# Patient Record
Sex: Male | Born: 1945 | Race: White | Hispanic: No | Marital: Married | Smoking: Never smoker
Health system: Southern US, Community
[De-identification: ages and names within clinical notes are randomized; demographics above are authoritative.]

## PROBLEM LIST (undated history)

## (undated) DIAGNOSIS — C801 Malignant (primary) neoplasm, unspecified: Secondary | ICD-10-CM

## (undated) DIAGNOSIS — I10 Essential (primary) hypertension: Secondary | ICD-10-CM

## (undated) DIAGNOSIS — N189 Chronic kidney disease, unspecified: Secondary | ICD-10-CM

## (undated) DIAGNOSIS — R319 Hematuria, unspecified: Secondary | ICD-10-CM

## (undated) DIAGNOSIS — C61 Malignant neoplasm of prostate: Secondary | ICD-10-CM

## (undated) DIAGNOSIS — N2889 Other specified disorders of kidney and ureter: Secondary | ICD-10-CM

## (undated) DIAGNOSIS — J45909 Unspecified asthma, uncomplicated: Secondary | ICD-10-CM

## (undated) DIAGNOSIS — R809 Proteinuria, unspecified: Secondary | ICD-10-CM

## (undated) DIAGNOSIS — E7800 Pure hypercholesterolemia, unspecified: Secondary | ICD-10-CM

## (undated) HISTORY — PX: EYE SURGERY: SHX253

## (undated) HISTORY — PX: PROSTATE SURGERY: SHX751

## (undated) HISTORY — PX: CATARACT EXTRACTION: SUR2

## (undated) HISTORY — PX: OTHER SURGICAL HISTORY: SHX169

---

## 2005-06-18 ENCOUNTER — Ambulatory Visit: Payer: Self-pay | Admitting: Urology

## 2005-07-01 ENCOUNTER — Other Ambulatory Visit: Payer: Self-pay

## 2005-07-03 ENCOUNTER — Ambulatory Visit: Payer: Self-pay | Admitting: Urology

## 2005-07-08 ENCOUNTER — Inpatient Hospital Stay: Payer: Self-pay | Admitting: Urology

## 2006-06-19 IMAGING — CR DG CLAVICLE*R*
1 series · 2 of 2 positions shown · non-contrast
Comparison: none

REASON FOR EXAM: ABNORMAL UPTAKE ON BONE SCAN
COMMENTS:

[Series 1: view not recorded · 0.17mm/px · 2 of 2 slices shown]
[im 1/2]
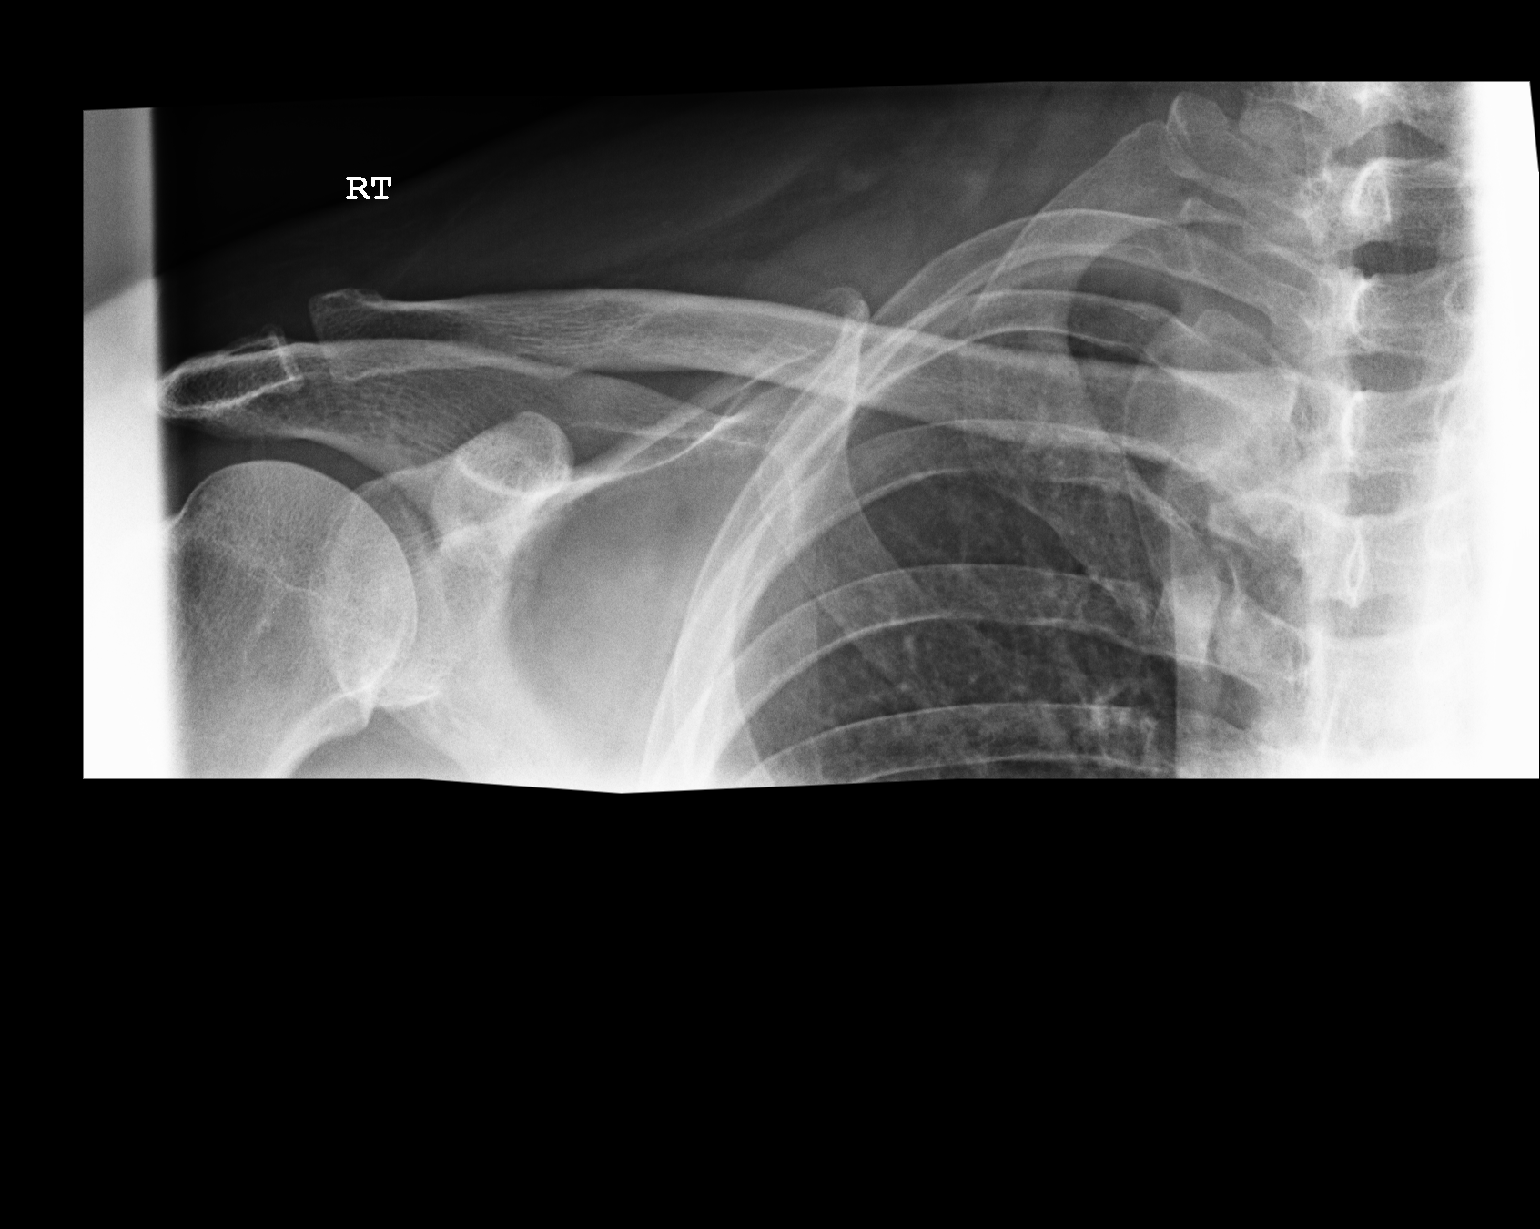
[im 2/2]
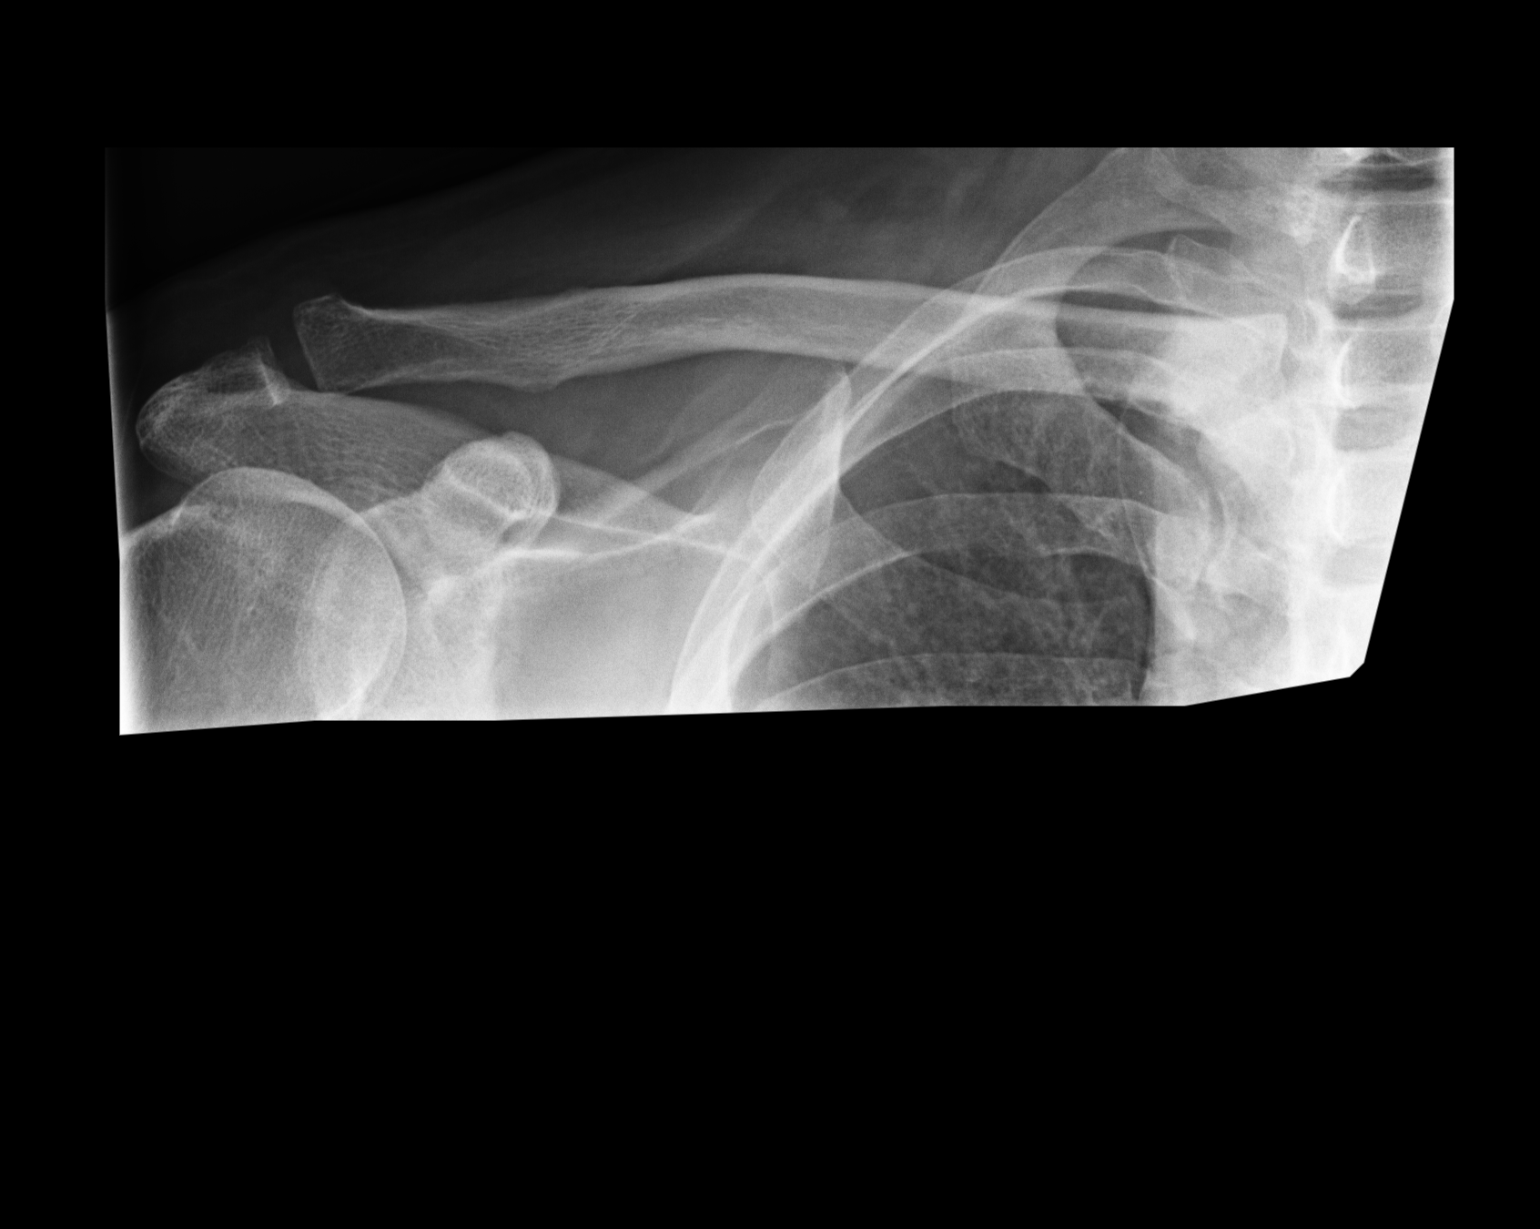

[2 of 2 positions shown; findings below may reference images not displayed]

PROCEDURE:     DXR - DXR CLAVICLE RIGHT  - July 03, 2005  [DATE]

RESULT:       Evaluation of the RIGHT clavicle demonstrates no evidence of
fracture, dislocation or malalignment.  There does not appear to be evidence
of lytic or sclerotic lesions within the RIGHT clavicle.  The RIGHT lung
apex is unremarkable.  Incidental note is made of an azygous lobe.
IMPRESSION: Unremarkable RIGHT clavicle as described above.

## 2006-08-21 ENCOUNTER — Ambulatory Visit: Payer: Self-pay | Admitting: Gastroenterology

## 2009-12-03 ENCOUNTER — Ambulatory Visit: Payer: Self-pay | Admitting: Gastroenterology

## 2013-02-21 ENCOUNTER — Ambulatory Visit: Payer: Self-pay | Admitting: Gastroenterology

## 2017-07-20 ENCOUNTER — Emergency Department: Payer: Medicare Other

## 2017-07-20 ENCOUNTER — Encounter: Payer: Self-pay | Admitting: Emergency Medicine

## 2017-07-20 ENCOUNTER — Inpatient Hospital Stay
Admission: EM | Admit: 2017-07-20 | Discharge: 2017-07-22 | DRG: 176 | Disposition: A | Discharge status: Home / Self Care | Payer: Medicare Other | Attending: Internal Medicine | Admitting: Internal Medicine

## 2017-07-20 DIAGNOSIS — E785 Hyperlipidemia, unspecified: Secondary | ICD-10-CM | POA: Diagnosis present

## 2017-07-20 DIAGNOSIS — Z23 Encounter for immunization: Secondary | ICD-10-CM

## 2017-07-20 DIAGNOSIS — Z7982 Long term (current) use of aspirin: Secondary | ICD-10-CM | POA: Diagnosis not present

## 2017-07-20 DIAGNOSIS — N289 Disorder of kidney and ureter, unspecified: Secondary | ICD-10-CM | POA: Diagnosis present

## 2017-07-20 DIAGNOSIS — R1011 Right upper quadrant pain: Secondary | ICD-10-CM | POA: Diagnosis not present

## 2017-07-20 DIAGNOSIS — I82432 Acute embolism and thrombosis of left popliteal vein: Secondary | ICD-10-CM | POA: Diagnosis present

## 2017-07-20 DIAGNOSIS — Z79899 Other long term (current) drug therapy: Secondary | ICD-10-CM | POA: Diagnosis not present

## 2017-07-20 DIAGNOSIS — J45909 Unspecified asthma, uncomplicated: Secondary | ICD-10-CM | POA: Diagnosis present

## 2017-07-20 DIAGNOSIS — Z833 Family history of diabetes mellitus: Secondary | ICD-10-CM | POA: Diagnosis not present

## 2017-07-20 DIAGNOSIS — Z8546 Personal history of malignant neoplasm of prostate: Secondary | ICD-10-CM

## 2017-07-20 DIAGNOSIS — Z85820 Personal history of malignant melanoma of skin: Secondary | ICD-10-CM

## 2017-07-20 DIAGNOSIS — I10 Essential (primary) hypertension: Secondary | ICD-10-CM | POA: Diagnosis present

## 2017-07-20 DIAGNOSIS — Z8249 Family history of ischemic heart disease and other diseases of the circulatory system: Secondary | ICD-10-CM

## 2017-07-20 DIAGNOSIS — I2699 Other pulmonary embolism without acute cor pulmonale: Principal | ICD-10-CM | POA: Diagnosis present

## 2017-07-20 DIAGNOSIS — Z808 Family history of malignant neoplasm of other organs or systems: Secondary | ICD-10-CM

## 2017-07-20 HISTORY — DX: Essential (primary) hypertension: I10

## 2017-07-20 HISTORY — DX: Unspecified asthma, uncomplicated: J45.909

## 2017-07-20 HISTORY — DX: Pure hypercholesterolemia, unspecified: E78.00

## 2017-07-20 HISTORY — DX: Malignant (primary) neoplasm, unspecified: C80.1

## 2017-07-20 HISTORY — DX: Malignant neoplasm of prostate: C61

## 2017-07-20 LAB — URINALYSIS, COMPLETE (UACMP) WITH MICROSCOPIC
BACTERIA UA: NONE SEEN
BILIRUBIN URINE: NEGATIVE
GLUCOSE, UA: NEGATIVE mg/dL
KETONES UR: 5 mg/dL — AB
LEUKOCYTES UA: NEGATIVE
NITRITE: NEGATIVE
PROTEIN: 100 mg/dL — AB
Specific Gravity, Urine: 1.011 (ref 1.005–1.030)
pH: 5 (ref 5.0–8.0)

## 2017-07-20 LAB — CBC
HEMATOCRIT: 46.1 % (ref 40.0–52.0)
HEMOGLOBIN: 15.7 g/dL (ref 13.0–18.0)
MCH: 29.8 pg (ref 26.0–34.0)
MCHC: 34.1 g/dL (ref 32.0–36.0)
MCV: 87.5 fL (ref 80.0–100.0)
Platelets: 261 10*3/uL (ref 150–440)
RBC: 5.27 MIL/uL (ref 4.40–5.90)
RDW: 13.4 % (ref 11.5–14.5)
WBC: 10 10*3/uL (ref 3.8–10.6)

## 2017-07-20 LAB — COMPREHENSIVE METABOLIC PANEL
ALBUMIN: 4.1 g/dL (ref 3.5–5.0)
ALT: 22 U/L (ref 17–63)
ANION GAP: 8 (ref 5–15)
AST: 22 U/L (ref 15–41)
Alkaline Phosphatase: 70 U/L (ref 38–126)
BILIRUBIN TOTAL: 0.7 mg/dL (ref 0.3–1.2)
BUN: 17 mg/dL (ref 6–20)
CO2: 27 mmol/L (ref 22–32)
Calcium: 10 mg/dL (ref 8.9–10.3)
Chloride: 102 mmol/L (ref 101–111)
Creatinine, Ser: 1.3 mg/dL — ABNORMAL HIGH (ref 0.61–1.24)
GFR calc Af Amer: >60 mL/min (ref >60)
GFR calc non Af Amer: 54 mL/min — ABNORMAL LOW (ref >60)
GLUCOSE: 107 mg/dL — AB (ref 65–99)
POTASSIUM: 4.5 mmol/L (ref 3.5–5.1)
SODIUM: 137 mmol/L (ref 135–145)
TOTAL PROTEIN: 7.8 g/dL (ref 6.5–8.1)

## 2017-07-20 LAB — TROPONIN I: Troponin I: <0.03 ng/mL (ref <0.03)

## 2017-07-20 LAB — LIPASE, BLOOD: LIPASE: 21 U/L (ref 11–51)

## 2017-07-20 MED ORDER — SODIUM CHLORIDE 0.9 % IV BOLUS (SEPSIS)
1000.0000 mL | Freq: Once | INTRAVENOUS | Status: AC
  Administered 2017-07-20: 1000 mL via INTRAVENOUS

## 2017-07-20 MED ORDER — HEPARIN (PORCINE) IN NACL 100-0.45 UNIT/ML-% IJ SOLN: 12.0000 [IU]/kg/h | INTRAMUSCULAR | Status: DC

## 2017-07-20 MED ORDER — HEPARIN BOLUS VIA INFUSION: 4000.0000 [IU] | Freq: Once | INTRAVENOUS | Status: DC

## 2017-07-20 MED ORDER — IOPAMIDOL (ISOVUE-370) INJECTION 76%
75.0000 mL | Freq: Once | INTRAVENOUS | Status: AC | PRN
  Administered 2017-07-20: 75 mL via INTRAVENOUS

## 2017-07-20 MED ORDER — HYDROMORPHONE HCL 1 MG/ML IJ SOLN
0.5000 mg | Freq: Once | INTRAMUSCULAR | Status: AC
  Administered 2017-07-20: 0.5 mg via INTRAVENOUS
  Filled 2017-07-20: qty 1

## 2017-07-20 NOTE — ED Triage Notes (Signed)
Pt c/o right side upper abdominal pain x2 weeks, worse after eating "certain foods" and worse when taking a deep breath. Pt denies chest pain, SOB, as well as N/V/D.

## 2017-07-20 NOTE — ED Provider Notes (Signed)
[facility] Emergency Department Provider Note  ____________________________________________  Time seen: Approximately 10:16 PM  I have reviewed the triage vital signs and the nursing notes.   HISTORY  Chief Complaint Abdominal Pain    HPI Darin Fisher is a 71 y.o. male presenting with right lateral chest wall and right upper quadrant painfor the past 2 weeks. The patient describes a sharp pain that is intermittent and occurs after eating. He has no associated nausea vomiting or diarrhea. No dysuria or hematuria. No shortness of breath, palpitations, fever or chills. Deep breaths make his pain worse. The pain is better with standing and worse with laying down. He has noted intermittent cramping of the left calf when in bed as well.  FH: gallbladder disease; no blood clot hx   Past Medical History:  Diagnosis Date  . Asthma   . Cancer ([place])   . HTN (hypertension)   . Hypercholesterolemia   . Prostate CA ([place])     There are no active problems to display for this patient.   History reviewed. No pertinent surgical history.    Allergies Patient has no known allergies.  History reviewed. No pertinent family history.  Social History Social History  Substance Use Topics  . Smoking status: Never Smoker  . Smokeless tobacco: Never Used  . Alcohol use No    Review of Systems Constitutional: No fever/chills.no lightheadedness or syncope. No diaphoresis. Eyes: No visual changes. ENT: No sore throat. No congestion or rhinorrhea. Cardiovascular: Denies chest pain. Denies palpitations. Respiratory: Denies shortness of breath.  No cough. Gastrointestinal: positive right flank and right lateral, right upper quadrantabdominal pain.  No nausea, no vomiting.  No diarrhea.  No constipation. Genitourinary: Negative for dysuria.no hematuria. Musculoskeletal: Negative for back pain. Skin: Negative for rash. Neurological: Negative for headaches. No focal  numbness, tingling or weakness.     ____________________________________________   PHYSICAL EXAM:  VITAL SIGNS: ED Triage Vitals [07/20/17 1920]  Enc Vitals Group     BP (!) 148/90     Pulse Rate 69     Resp 16     Temp 98 F (36.7 C)     Temp Source Oral     SpO2 100 %     Weight      Height 5\' 10"  (1.778 m)     Head Circumference      Peak Flow      Pain Score      Pain Loc      Pain Edu?      Excl. in [place]?     Constitutional: Alert and oriented. Well appearing and in no acute distress. Answers questions appropriately. Eyes: Conjunctivae are normal.  EOMI. No scleral icterus. Head: Atraumatic. Nose: No congestion/rhinnorhea. Mouth/Throat: Mucous membranes are moist.  Neck: No stridor.  Supple.  No JVD. Cardiovascular: Normal rate, regular rhythm. No murmurs, rubs or gallops.  Respiratory: Normal respiratory effort.  No accessory muscle use or retractions. Lungs CTAB.  No wheezes, rales or ronchi. Gastrointestinal: Soft, and nondistended.  The patient has tenderness to palpation over the most distal right lateral chest wall, and right CVA tenderness. He has minimal right upper quadrant tenderness and a negative Murphy sign.  No guarding or rebound.  No peritoneal signs. Musculoskeletal: No LE edema. No ttp in the calves or palpable cords.  Negative Homan's sign. Neurologic:  A&Ox3.  Speech is clear.  Face and smile are symmetric.  EOMI.  Moves all extremities well. Skin:  Skin is warm, dry  and intact. No rash noted. Psychiatric: Mood and affect are normal. Speech and behavior are normal.  Normal judgement.  ____________________________________________   LABS (all labs ordered are listed, but only abnormal results are displayed)  Labs Reviewed  COMPREHENSIVE METABOLIC PANEL - Abnormal; Notable for the following:       Result Value   Glucose, Bld 107 (*)    Creatinine, Ser 1.30 (*)    GFR calc non Af Amer 54 (*)    All other components within normal limits   URINALYSIS, COMPLETE (UACMP) WITH MICROSCOPIC - Abnormal; Notable for the following:    Color, Urine YELLOW (*)    APPearance CLEAR (*)    Hgb urine dipstick SMALL (*)    Ketones, ur 5 (*)    Protein, ur 100 (*)    Squamous Epithelial / LPF 0-5 (*)    All other components within normal limits  LIPASE, BLOOD  CBC  TROPONIN I  PROTIME-INR  APTT   ____________________________________________  EKG  ED ECG REPORT I, Eula Listen, the attending physician, personally viewed and interpreted this ECG.   Date: 07/20/2017  EKG Time: 1918  Rate: 61  Rhythm: normal sinus rhythm  Axis: normal  Intervals:none  ST&T Change: nonspecic twave inversion V1; no STEMI  ____________________________________________  RADIOLOGY  Dg Chest 2 View  Result Date: 07/20/2017 CLINICAL DATA:  Right-sided upper abdominal pain x2 weeks. EXAM: CHEST  2 VIEW COMPARISON:  None. FINDINGS: The heart size and mediastinal contours are within normal limits. An azygos fissure is of incidental note. Both lungs are clear. There is minimal blunting the right posterior and lateral costophrenic angles may represent a tiny effusion. The visualized skeletal structures are unremarkable. IMPRESSION: No active cardiopulmonary disease. Minimal blunting of the right posterior and lateral costophrenic angles may reflect a tiny effusion or pleural thickening. Electronically Signed   By: Ashley Royalty M.D.   On: 07/20/2017 20:03   US Abdomen Limited Ruq  Result Date: 07/20/2017 CLINICAL DATA:  Right upper quadrant pain times 12 hours. EXAM: ULTRASOUND ABDOMEN LIMITED RIGHT UPPER QUADRANT COMPARISON:  None. FINDINGS: Gallbladder: No gallstones or wall thickening visualized. No sonographic Murphy sign noted by sonographer. Common bile duct: Diameter: Normal at 3 mm Liver: No focal lesion identified. Slight increase in echogenicity of the liver parenchyma. Portal vein is patent on color Doppler imaging with normal direction of  blood flow towards the liver. IMPRESSION: No sonographic findings for the patient's pain. Slight increase in echogenicity of the liver parenchyma may represent fatty infiltration. Electronically Signed   By: Ashley Royalty M.D.   On: 07/20/2017 20:40    ____________________________________________   PROCEDURES  Procedure(s) performed: None  Procedures  Critical Care performed: No ____________________________________________   INITIAL IMPRESSION / ASSESSMENT AND PLAN / ED COURSE  Pertinent labs & imaging results that were available during my care of the patient were reviewed by me and considered in my medical decision making (see chart for details).  71 y.o. Malepresenting with right CVA and right-sided abdominal pain. Overall, the patient is hemodynamically stable and afebrile. While his symptoms are classic for gallbladder disease the patient has already had an ultrasound of the right upper quadrant which did not show any gallbladder illness. I am concerned about PE given how he is splinting. Renal colic is also a possible etiology. We'll get a CT of the abdomen, CT angiogram of the chest, and initiate symptomatic treatment. Plan reevaluation for final disposition.  ----------------------------------------- 11:38 PM on 07/20/2017 -----------------------------------------  The patient's  CT chest does show bilateral PEs with concern for right heart strain. This time, I have admitted the patient to the hospitalist. I have ordered a heparin bolus and infusion. The patient is aware of the results of his findings.  I have reviewed the patient's medical history.  ____________________________________________  FINAL CLINICAL IMPRESSION(S) / ED DIAGNOSES  Final diagnoses:  Other acute pulmonary embolism without acute cor pulmonale (HCC)         NEW MEDICATIONS STARTED DURING THIS VISIT:  New Prescriptions   No medications on file      Eula Listen, MD 07/20/17 2339

## 2017-07-21 ENCOUNTER — Encounter: Payer: Self-pay | Admitting: Internal Medicine

## 2017-07-21 ENCOUNTER — Inpatient Hospital Stay: Payer: Medicare Other

## 2017-07-21 ENCOUNTER — Inpatient Hospital Stay
Admit: 2017-07-21 | Discharge: 2017-07-21 | Disposition: A | Discharge status: Home / Self Care | Payer: Medicare Other | Attending: Internal Medicine | Admitting: Internal Medicine

## 2017-07-21 LAB — BASIC METABOLIC PANEL
Anion gap: 8 (ref 5–15)
BUN: 17 mg/dL (ref 6–20)
CALCIUM: 9.1 mg/dL (ref 8.9–10.3)
CHLORIDE: 103 mmol/L (ref 101–111)
CO2: 24 mmol/L (ref 22–32)
CREATININE: 1.42 mg/dL — AB (ref 0.61–1.24)
GFR calc non Af Amer: 49 mL/min — ABNORMAL LOW (ref >60)
GFR, EST AFRICAN AMERICAN: 57 mL/min — AB (ref >60)
GLUCOSE: 96 mg/dL (ref 65–99)
Potassium: 4.1 mmol/L (ref 3.5–5.1)
Sodium: 135 mmol/L (ref 135–145)

## 2017-07-21 LAB — HEPARIN LEVEL (UNFRACTIONATED): HEPARIN UNFRACTIONATED: 0.69 [IU]/mL (ref 0.30–0.70)

## 2017-07-21 LAB — CBC
HCT: 42.5 % (ref 40.0–52.0)
Hemoglobin: 14.5 g/dL (ref 13.0–18.0)
MCH: 29.9 pg (ref 26.0–34.0)
MCHC: 34.2 g/dL (ref 32.0–36.0)
MCV: 87.6 fL (ref 80.0–100.0)
PLATELETS: 241 10*3/uL (ref 150–440)
RBC: 4.86 MIL/uL (ref 4.40–5.90)
RDW: 13.2 % (ref 11.5–14.5)
WBC: 7.2 10*3/uL (ref 3.8–10.6)

## 2017-07-21 LAB — APTT: aPTT: 25 seconds (ref 24–36)

## 2017-07-21 LAB — PROTIME-INR
INR: 1.01
Prothrombin Time: 13.2 seconds (ref 11.4–15.2)

## 2017-07-21 MED ORDER — ONDANSETRON HCL 4 MG PO TABS: 4.0000 mg | ORAL_TABLET | Freq: Four times a day (QID) | ORAL | Status: DC | PRN

## 2017-07-21 MED ORDER — GLUCOSAMINE-CHONDROITIN 500-400 MG PO TABS: 1.0000 | ORAL_TABLET | Freq: Three times a day (TID) | ORAL | Status: DC

## 2017-07-21 MED ORDER — DORZOLAMIDE HCL 2 % OP SOLN
1.0000 [drp] | Freq: Two times a day (BID) | OPHTHALMIC | Status: DC
  Administered 2017-07-21: 1 [drp] via OPHTHALMIC
  Filled 2017-07-21: qty 10

## 2017-07-21 MED ORDER — ONDANSETRON HCL 4 MG/2ML IJ SOLN: 4.0000 mg | Freq: Four times a day (QID) | INTRAMUSCULAR | Status: DC | PRN

## 2017-07-21 MED ORDER — PNEUMOCOCCAL VAC POLYVALENT 25 MCG/0.5ML IJ INJ
0.5000 mL | INJECTION | INTRAMUSCULAR | Status: AC
  Administered 2017-07-22: 0.5 mL via INTRAMUSCULAR
  Filled 2017-07-21: qty 0.5

## 2017-07-21 MED ORDER — APIXABAN 5 MG PO TABS: ORAL_TABLET | ORAL | 1 refills | Status: AC

## 2017-07-21 MED ORDER — HEPARIN BOLUS VIA INFUSION
5700.0000 [IU] | Freq: Once | INTRAVENOUS | Status: AC
  Administered 2017-07-21: 5700 [IU] via INTRAVENOUS
  Filled 2017-07-21: qty 5700

## 2017-07-21 MED ORDER — HEPARIN (PORCINE) IN NACL 100-0.45 UNIT/ML-% IJ SOLN
1600.0000 [IU]/h | INTRAMUSCULAR | Status: DC
  Administered 2017-07-21 (×2): 1600 [IU]/h via INTRAVENOUS
  Filled 2017-07-21 (×2): qty 250

## 2017-07-21 MED ORDER — KETOROLAC TROMETHAMINE 15 MG/ML IJ SOLN
15.0000 mg | Freq: Four times a day (QID) | INTRAMUSCULAR | Status: DC | PRN
Start: 2017-07-21 — End: 2017-07-22
  Administered 2017-07-21: 15 mg via INTRAVENOUS
  Filled 2017-07-21: qty 1

## 2017-07-21 MED ORDER — SODIUM CHLORIDE 0.9 % IV SOLN: 250.0000 mL | INTRAVENOUS | Status: DC | PRN

## 2017-07-21 MED ORDER — ACETAMINOPHEN 650 MG RE SUPP: 650.0000 mg | Freq: Four times a day (QID) | RECTAL | Status: DC | PRN

## 2017-07-21 MED ORDER — SENNOSIDES-DOCUSATE SODIUM 8.6-50 MG PO TABS: 1.0000 | ORAL_TABLET | Freq: Every evening | ORAL | Status: DC | PRN

## 2017-07-21 MED ORDER — HYDROCODONE-ACETAMINOPHEN 5-325 MG PO TABS
1.0000 | ORAL_TABLET | ORAL | Status: DC | PRN
  Administered 2017-07-21 (×2): 1 via ORAL
  Filled 2017-07-21 (×2): qty 1

## 2017-07-21 MED ORDER — SODIUM CHLORIDE 0.9% FLUSH: 3.0000 mL | INTRAVENOUS | Status: DC | PRN

## 2017-07-21 MED ORDER — ASPIRIN EC 81 MG PO TBEC
81.0000 mg | DELAYED_RELEASE_TABLET | Freq: Every day | ORAL | Status: DC
  Administered 2017-07-21 – 2017-07-22 (×2): 81 mg via ORAL
  Filled 2017-07-21 (×2): qty 1

## 2017-07-21 MED ORDER — INFLUENZA VAC SPLIT HIGH-DOSE 0.5 ML IM SUSY
0.5000 mL | PREFILLED_SYRINGE | INTRAMUSCULAR | Status: AC
  Administered 2017-07-22: 0.5 mL via INTRAMUSCULAR
  Filled 2017-07-21: qty 0.5

## 2017-07-21 MED ORDER — APIXABAN 5 MG PO TABS: 5.0000 mg | ORAL_TABLET | Freq: Two times a day (BID) | ORAL | Status: DC

## 2017-07-21 MED ORDER — ACETAMINOPHEN 325 MG PO TABS: 650.0000 mg | ORAL_TABLET | Freq: Four times a day (QID) | ORAL | Status: DC | PRN

## 2017-07-21 MED ORDER — SIMVASTATIN 20 MG PO TABS
20.0000 mg | ORAL_TABLET | Freq: Every day | ORAL | Status: DC
  Administered 2017-07-21 – 2017-07-22 (×2): 20 mg via ORAL
  Filled 2017-07-21 (×2): qty 1

## 2017-07-21 MED ORDER — LISINOPRIL 20 MG PO TABS
20.0000 mg | ORAL_TABLET | Freq: Every day | ORAL | Status: DC
  Administered 2017-07-21 – 2017-07-22 (×2): 20 mg via ORAL
  Filled 2017-07-21 (×2): qty 1

## 2017-07-21 MED ORDER — BISACODYL 5 MG PO TBEC: 5.0000 mg | DELAYED_RELEASE_TABLET | Freq: Every day | ORAL | Status: DC | PRN

## 2017-07-21 MED ORDER — SODIUM CHLORIDE 0.9% FLUSH
3.0000 mL | Freq: Two times a day (BID) | INTRAVENOUS | Status: DC
  Administered 2017-07-21 – 2017-07-22 (×4): 3 mL via INTRAVENOUS

## 2017-07-21 MED ORDER — DORZOLAMIDE HCL 2 % OP SOLN
1.0000 [drp] | Freq: Every day | OPHTHALMIC | Status: DC
  Administered 2017-07-21: 1 [drp] via OPHTHALMIC
  Filled 2017-07-21: qty 10

## 2017-07-21 MED ORDER — APIXABAN 5 MG PO TABS
10.0000 mg | ORAL_TABLET | Freq: Two times a day (BID) | ORAL | Status: DC
  Administered 2017-07-21 – 2017-07-22 (×3): 10 mg via ORAL
  Filled 2017-07-21 (×3): qty 2

## 2017-07-21 MED ORDER — ALBUTEROL SULFATE (2.5 MG/3ML) 0.083% IN NEBU: 2.5000 mg | INHALATION_SOLUTION | RESPIRATORY_TRACT | Status: DC | PRN

## 2017-07-21 MED ORDER — LATANOPROST 0.005 % OP SOLN
1.0000 [drp] | Freq: Every day | OPHTHALMIC | Status: DC
  Administered 2017-07-21: 1 [drp] via OPHTHALMIC
  Filled 2017-07-21: qty 2.5

## 2017-07-21 NOTE — Progress Notes (Signed)
ANTICOAGULATION CONSULT NOTE - Initial Consult  Pharmacy Consult for heparin drip Indication: pulmonary embolus  No Known Allergies  Patient Measurements: Height: 5\' 10"  (177.8 cm) Weight: 230 lb 3.2 oz (104.4 kg) IBW/kg (Calculated) : 73 Heparin Dosing Weight: 95 kg  Vital Signs: Temp: 98.1 F (36.7 C) (10/16 0751) Temp Source: Oral (10/16 0751) BP: 168/91 (10/16 0751) Pulse Rate: 62 (10/16 0751)  Labs:  Recent Labs  07/20/17 1919 07/20/17 2346 07/21/17 0709  HGB 15.7  --  14.5  HCT 46.1  --  42.5  PLT 261  --  241  APTT  --  25  --   LABPROT  --  13.2  --   INR  --  1.01  --   HEPARINUNFRC  --   --  0.69  CREATININE 1.30*  --  1.42*  TROPONINI <0.03  --   --     Estimated Creatinine Clearance: 59.4 mL/min (A) (by C-G formula based on SCr of 1.42 mg/dL (H)).   Medical History: Past Medical History:  Diagnosis Date  . Asthma   . Cancer ([place])   . HTN (hypertension)   . Hypercholesterolemia   . Prostate CA ([place])     Medications:  No anticoagulation in PTA meds  Assessment: Patient is a 71 year old male who presents with lateral chest wall and right upper quadrant pain. According to ER physician note, PE was found on CT. Pharmacy consulted to dose heparin drip.  Goal of Therapy:  Heparin level 0.3-0.7 units/ml Monitor platelets by anticoagulation protocol: Yes   Plan:  Heparin level therapeutic at 0.69. Continue rate of 1600 units/hr and recheck in 6 hours  Tanzie Rothschild D Johnell Bas, Pharm.D, BCPS Clinical Pharmacist  07/21/2017,7:56 AM

## 2017-07-21 NOTE — Progress Notes (Signed)
ANTICOAGULATION CONSULT NOTE - Initial Consult  Pharmacy Consult for heparin drip Indication: pulmonary embolus  No Known Allergies  Patient Measurements: Height: 5\' 10"  (177.8 cm) Weight: 230 lb 3.2 oz (104.4 kg) IBW/kg (Calculated) : 73 Heparin Dosing Weight: 95 kg  Vital Signs: Temp: 98.7 F (37.1 C) (10/16 0044) Temp Source: Oral (10/16 0044) BP: 172/96 (10/16 0044) Pulse Rate: 71 (10/16 0044)  Labs:  Recent Labs  07/20/17 1919 07/20/17 2346  HGB 15.7  --   HCT 46.1  --   PLT 261  --   APTT  --  25  LABPROT  --  13.2  INR  --  1.01  CREATININE 1.30*  --   TROPONINI <0.03  --     Estimated Creatinine Clearance: 64.9 mL/min (A) (by C-G formula based on SCr of 1.3 mg/dL (H)).   Medical History: Past Medical History:  Diagnosis Date  . Asthma   . Cancer ([place])   . HTN (hypertension)   . Hypercholesterolemia   . Prostate CA (HCC)     Medications:  No anticoagulation in PTA meds  Assessment:  Goal of Therapy:  Heparin level 0.3-0.7 units/ml Monitor platelets by anticoagulation protocol: Yes   Plan:  5700 unit bolus and initial rate of 1600 units/hr. First heparin level 6 hours after start of infusion.  Christoph Copelan S 07/21/2017,2:52 AM

## 2017-07-21 NOTE — H&P (Addendum)
Darin Fisher at [place] NAME: Darin Fisher    MR#:  [id number]  DATE OF BIRTH:  12-26-1945  DATE OF ADMISSION:  07/20/2017  PRIMARY CARE PHYSICIAN: Tracie Harrier, MD   REQUESTING/REFERRING PHYSICIAN: Eula Listen, MD  CHIEF COMPLAINT:   Chief Complaint  Patient presents with  . Abdominal Pain   Right lateral chest wall and the right upper quadrant worsening pain for 2 weeks HISTORY OF PRESENT ILLNESS:  Darin Fisher  is a 71 y.o. male with a known history of prostate cancer, hypertension, hyperlipidemia and asthma. The patient presented to the ED with the above chief complaints. The abdominal and chest pain is sharp, intermittent, extubated after eating. He has no fever or chills, no shortness of breath or palpitation, no nausea vomiting or diarrhea. He denies any leg swelling or tenderness.No recent travel history or long distance travel.  PAST MEDICAL HISTORY:   Past Medical History:  Diagnosis Date  . Asthma   . Cancer ([place])   . HTN (hypertension)   . Hypercholesterolemia   . Prostate CA [facility])     PAST SURGICAL HISTORY:   Past Surgical History:  Procedure Laterality Date  . MELANOMA REMOVAL, LEFT WRIST    . PROSTATE SURGERY      SOCIAL HISTORY:   Social History  Substance Use Topics  . Smoking status: Never Smoker  . Smokeless tobacco: Never Used  . Alcohol use No    FAMILY HISTORY:   Family History  Problem Relation Age of Onset  . Diabetes Mother   . Breast cancer Mother   . Heart disease Father   . Skin cancer Father   . Diabetes Sister   . Diabetes Brother     DRUG ALLERGIES:  No Known Allergies  REVIEW OF SYSTEMS:   Review of Systems  Constitutional: Negative for chills, fever and malaise/fatigue.  HENT: Negative for sore throat.   Eyes: Negative for blurred vision and double vision.  Respiratory: Negative for cough, hemoptysis, shortness of breath, wheezing and stridor.     Cardiovascular: Positive for chest pain. Negative for palpitations, orthopnea and leg swelling.  Gastrointestinal: Positive for abdominal pain. Negative for blood in stool, diarrhea, melena, nausea and vomiting.  Genitourinary: Negative for dysuria, flank pain and hematuria.  Musculoskeletal: Negative for back pain and joint pain.  Skin: Negative for rash.  Neurological: Negative for dizziness, sensory change, focal weakness, seizures, loss of consciousness, weakness and headaches.  Endo/Heme/Allergies: Negative for polydipsia.  Psychiatric/Behavioral: Negative for depression. The patient is not nervous/anxious.     MEDICATIONS AT HOME:   Prior to Admission medications   Medication Sig Start Date End Date Taking? Authorizing Provider  aspirin EC 81 MG tablet Take 81 mg by mouth daily.   Yes [provider]  dorzolamide (TRUSOPT) 2 % ophthalmic solution Place 1 drop into both eyes daily. 04/21/17  Yes [provider]  glucosamine-chondroitin 500-400 MG tablet Take 1 tablet by mouth 3 (three) times daily.   Yes [provider]  Ipratropium-Albuterol (COMBIVENT IN) Inhale 2 puffs into the lungs every 6 (six) hours as needed.   Yes [provider]  latanoprost (XALATAN) 0.005 % ophthalmic solution Place 1 drop into both eyes at bedtime. 04/21/17  Yes [provider]  lisinopril (PRINIVIL,ZESTRIL) 20 MG tablet Take 20 mg by mouth daily. 06/01/17  Yes [provider]  Multiple Vitamin (MULTI-VITAMINS) TABS Take 1 tablet by mouth daily.   Yes [provider]  Omega-3  1000 MG CAPS Take 1 g by mouth daily.   Yes [provider]  simvastatin (ZOCOR) 20 MG tablet Take 20 mg by mouth daily. 06/01/17  Yes [provider]      VITAL SIGNS:  Blood pressure (!) 156/93, pulse 69, temperature 98 F (36.7 C), temperature source Oral, resp. rate 14, height 5\' 10"  (1.778 m), SpO2 96 %.  PHYSICAL EXAMINATION:  Physical  Exam  GENERAL:  71 y.o.-year-old patient lying in the bed with no acute distress.  EYES: Pupils equal, round, reactive to light and accommodation. No scleral icterus. Extraocular muscles intact.  HEENT: Head atraumatic, normocephalic. Oropharynx and nasopharynx clear.  NECK:  Supple, no jugular venous distention. No thyroid enlargement, no tenderness.  LUNGS: Normal breath sounds bilaterally, no wheezing, rales,rhonchi or crepitation. No use of accessory muscles of respiration.  CARDIOVASCULAR: S1, S2 normal. No murmurs, rubs, or gallops.  ABDOMEN: Soft, nontender, nondistended. Bowel sounds present. No organomegaly or mass.  EXTREMITIES: No pedal edema, cyanosis, or clubbing. No calf swelling or tenderness. NEUROLOGIC: Cranial nerves II through XII are intact. Muscle strength 5/5 in all extremities. Sensation intact. Gait not checked.  PSYCHIATRIC: The patient is alert and oriented x 3.  SKIN: No obvious rash, lesion, or ulcer.   LABORATORY PANEL:   CBC  Recent Labs Lab 07/20/17 1919  WBC 10.0  HGB 15.7  HCT 46.1  PLT 261   ------------------------------------------------------------------------------------------------------------------  Chemistries   Recent Labs Lab 07/20/17 1919  NA 137  K 4.5  CL 102  CO2 27  GLUCOSE 107*  BUN 17  CREATININE 1.30*  CALCIUM 10.0  AST 22  ALT 22  ALKPHOS 70  BILITOT 0.7   ------------------------------------------------------------------------------------------------------------------  Cardiac Enzymes  Recent Labs Lab 07/20/17 1919  TROPONINI <0.03   ------------------------------------------------------------------------------------------------------------------  RADIOLOGY:  Dg Chest 2 View  Result Date: 07/20/2017 CLINICAL DATA:  Right-sided upper abdominal pain x2 weeks. EXAM: CHEST  2 VIEW COMPARISON:  None. FINDINGS: The heart size and mediastinal contours are within normal limits. An azygos fissure is of incidental  note. Both lungs are clear. There is minimal blunting the right posterior and lateral costophrenic angles may represent a tiny effusion. The visualized skeletal structures are unremarkable. IMPRESSION: No active cardiopulmonary disease. Minimal blunting of the right posterior and lateral costophrenic angles may reflect a tiny effusion or pleural thickening. Electronically Signed   By: Ashley Royalty M.D.   On: 07/20/2017 20:03   Ct Angio Chest Pe W And/or Wo Contrast  Addendum Date: 07/21/2017   ADDENDUM REPORT: 07/20/2017 23:48 EXAM: CT renal stone protocol TECHNIQUE: Multi detector CT imaging of the abdomen pelvis performed without intravenous contrast. Multi planar CT image reconstructions were also obtained. COMPARISON:  None FINDINGS: Lower chest: Patchy ground-glass density in the posterior right lower lobe and lateral peripheral right lower lobe. Coronary artery calcification. Borderline to mild cardiomegaly. Hepatobiliary: Negative for calcified gallstones or biliary dilatation. No focal hepatic abnormality. Pancreas:  Negative for inflammation or ductal dilatation Spleen:  Normal in size and appearance.  Granuloma. Adrenals and kidneys: Adrenal glands are within normal limits. No hydronephrosis or ureteral stone. Exophytic 12 mm intermediate density lesion mid to lower pole right kidney. Bladder unremarkable. Stomach, small bowel, colon: The stomach is nonenlarged. There is no dilated small bowel. No colon wall thickening. Normal appendix. Reproductive:  Surgical clip in the pelvis.  No masses. Other: Negative for free air or free fluid. Small fat in the inguinal canals. Musculoskeletal: Negative for acute or suspicious bone lesion. Grade  1 anterolisthesis of L4 on L5. IMPRESSION: 1. Negative for nephrolithiasis, hydronephrosis or ureteral stone 2. 12 mm exophytic intermediate density lesion lower pole right kidney, suggest nonemergent renal CT or MRI for further evaluation. Electronically Signed   By:  Donavan Foil M.D.   On: 07/20/2017 23:48   Result Date: 07/20/2017 CLINICAL DATA:  Chest pain, right lower chest pain EXAM: CT ANGIOGRAPHY CHEST WITH CONTRAST TECHNIQUE: Multidetector CT imaging of the chest was performed using the standard protocol during bolus administration of intravenous contrast. Multiplanar CT image reconstructions and MIPs were obtained to evaluate the vascular anatomy. CONTRAST:  75 mL Isovue 370 intravenous COMPARISON:  Radiograph 07/20/2017 FINDINGS: Cardiovascular: Satisfactory opacification of the pulmonary arteries to the segmental level. Multiple bilateral nonocclusive filling defects visualize within lobar, segmental and subsegmental branches of the left lower and upper lobes, segmental branches of the right middle lobe, and segmental and subsegmental branches of the right lower lobe. RV LV ratio is slightly elevated at 0.97. 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 No dissection. Atherosclerotic calcifications. Azygos lobe. Mild coronary calcification. Mild cardiomegaly. No pericardial effusion Mediastinum/Nodes: No enlarged mediastinal, hilar, or axillary lymph nodes. Thyroid gland, trachea, and esophagus demonstrate no significant findings. Lungs/Pleura: Mild ground-glass density within the right lower lobe posteriorly and laterally which may reflect pulmonary infarct. No effusion. No pneumothorax Upper Abdomen: Reflux of contrast into the hepatic veins consistent with elevated right heart pressure. No acute abnormality. Splenic granuloma. Musculoskeletal: Degenerative changes. No acute or suspicious bone lesion Review of the MIP images confirms the above findings. IMPRESSION: Positive for acute bilateral nonocclusive pulmonary emboli within the bilateral lower lobes, right middle lobe and left upper lobe. Positive for acute PE with CT evidence of right heart strain (RV/LV Ratio = 0.97) consistent with at least submassive (intermediate risk) PE. The  presence of right heart strain has been associated with an increased risk of morbidity and mortality. Please activate Code PE by paging [phone]. Ground-glass density within the peripheral and posterior right lower lobe suspicious for pulmonary infarct. 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 Recommend annual imaging followup by CTA or MRA. This recommendation follows 2010 ACCF/AHA/AATS/ACR/ASA/SCA/SCAI/SIR/STS/SVM Guidelines for the Diagnosis and Management of Patients with Thoracic Aortic Disease. Circulation. 2010; 121: H299-M426 Critical Value/emergent results were called by telephone at the time of interpretation on 07/20/2017 at 11:37 pm to Dr. Eula Listen , who verbally acknowledged these results. Aortic Atherosclerosis (ICD10-I70.0). Electronically Signed: By: Donavan Foil M.D. On: 07/20/2017 23:38   Ct Renal Stone Study  Addendum Date: 07/21/2017   ADDENDUM REPORT: 07/20/2017 23:48 EXAM: CT renal stone protocol TECHNIQUE: Multi detector CT imaging of the abdomen pelvis performed without intravenous contrast. Multi planar CT image reconstructions were also obtained. COMPARISON:  None FINDINGS: Lower chest: Patchy ground-glass density in the posterior right lower lobe and lateral peripheral right lower lobe. Coronary artery calcification. Borderline to mild cardiomegaly. Hepatobiliary: Negative for calcified gallstones or biliary dilatation. No focal hepatic abnormality. Pancreas:  Negative for inflammation or ductal dilatation Spleen:  Normal in size and appearance.  Granuloma. Adrenals and kidneys: Adrenal glands are within normal limits. No hydronephrosis or ureteral stone. Exophytic 12 mm intermediate density lesion mid to lower pole right kidney. Bladder unremarkable. Stomach, small bowel, colon: The stomach is nonenlarged. There is no dilated small bowel. No colon wall thickening. Normal appendix. Reproductive:  Surgical clip in the pelvis.  No masses. Other:  Negative for free air or free fluid. Small  fat in the inguinal canals. Musculoskeletal: Negative for acute or suspicious bone lesion. Grade 1 anterolisthesis of L4 on L5. IMPRESSION: 1. Negative for nephrolithiasis, hydronephrosis or ureteral stone 2. 12 mm exophytic intermediate density lesion lower pole right kidney, suggest nonemergent renal CT or MRI for further evaluation. Electronically Signed   By: Donavan Foil M.D.   On: 07/20/2017 23:48   Result Date: 07/20/2017 CLINICAL DATA:  Chest pain, right lower chest pain EXAM: CT ANGIOGRAPHY CHEST WITH CONTRAST TECHNIQUE: Multidetector CT imaging of the chest was performed using the standard protocol during bolus administration of intravenous contrast. Multiplanar CT image reconstructions and MIPs were obtained to evaluate the vascular anatomy. CONTRAST:  75 mL Isovue 370 intravenous COMPARISON:  Radiograph 07/20/2017 FINDINGS: Cardiovascular: Satisfactory opacification of the pulmonary arteries to the segmental level. Multiple bilateral nonocclusive filling defects visualize within lobar, segmental and subsegmental branches of the left lower and upper lobes, segmental branches of the right middle lobe, and segmental and subsegmental branches of the right lower lobe. RV LV ratio is slightly elevated at 0.97. 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 No dissection. Atherosclerotic calcifications. Azygos lobe. Mild coronary calcification. Mild cardiomegaly. No pericardial effusion Mediastinum/Nodes: No enlarged mediastinal, hilar, or axillary lymph nodes. Thyroid gland, trachea, and esophagus demonstrate no significant findings. Lungs/Pleura: Mild ground-glass density within the right lower lobe posteriorly and laterally which may reflect pulmonary infarct. No effusion. No pneumothorax Upper Abdomen: Reflux of contrast into the hepatic veins consistent with elevated right heart pressure. No acute abnormality. Splenic granuloma.  Musculoskeletal: Degenerative changes. No acute or suspicious bone lesion Review of the MIP images confirms the above findings. IMPRESSION: Positive for acute bilateral nonocclusive pulmonary emboli within the bilateral lower lobes, right middle lobe and left upper lobe. Positive for acute PE with CT evidence of right heart strain (RV/LV Ratio = 0.97) consistent with at least submassive (intermediate risk) PE. The presence of right heart strain has been associated with an increased risk of morbidity and mortality. Please activate Code PE by paging [phone]. Ground-glass density within the peripheral and posterior right lower lobe suspicious for pulmonary infarct. 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 Recommend annual imaging followup by CTA or MRA. This recommendation follows 2010 ACCF/AHA/AATS/ACR/ASA/SCA/SCAI/SIR/STS/SVM Guidelines for the Diagnosis and Management of Patients with Thoracic Aortic Disease. Circulation. 2010; 121: [id number] Critical Value/emergent results were called by telephone at the time of interpretation on 07/20/2017 at 11:37 pm to Dr. Eula Listen , who verbally acknowledged these results. Aortic Atherosclerosis (ICD10-I70.0). Electronically Signed: By: Donavan Foil M.D. On: 07/20/2017 23:38   US Abdomen Limited Ruq  Result Date: 07/20/2017 CLINICAL DATA:  Right upper quadrant pain times 12 hours. EXAM: ULTRASOUND ABDOMEN LIMITED RIGHT UPPER QUADRANT COMPARISON:  None. FINDINGS: Gallbladder: No gallstones or wall thickening visualized. No sonographic Murphy sign noted by sonographer. Common bile duct: Diameter: Normal at 3 mm Liver: No focal lesion identified. Slight increase in echogenicity of the liver parenchyma. Portal vein is patent on color Doppler imaging with normal direction of blood flow towards the liver. IMPRESSION: No sonographic findings for the patient's pain. Slight increase in echogenicity of the liver parenchyma may represent fatty  infiltration. Electronically Signed   By: Ashley Royalty M.D.   On: 07/20/2017 20:40      IMPRESSION AND PLAN:   Bilateral  PULMONARY EMBOLISM The patient will be admitted to medical floor with telemetry monitor. Continue heparin drip, follow-up bilateral leg venous ultrasound and echocardiogram.  Hypertension. Continue home hypertension medication. Renal insufficiency. follow-up BMP. Hyperlipidemia. Continue Zocor.  All the records are reviewed and case discussed with ED provider. Management plans discussed with the patient, his wife and they are in agreement.  CODE STATUS: full code  TOTAL TIME TAKING CARE OF THIS PATIENT:  55 minutes.    Demetrios Loll M.D on 07/21/2017 at 12:36 AM  Between 7am to 6pm - Pager - [phone]  After 6pm go to [email or web address] - Proofreader  [facility]  Office  [phone]  CC: Primary care physician; Tracie Harrier, MD   Note: This dictation was prepared with Dragon dictation along with smaller phrase technology. Any transcriptional errors that result from this process are unin

## 2017-07-21 NOTE — Plan of Care (Signed)
Problem: Pain Managment: Goal: General experience of comfort will improve Outcome: Not Progressing PRN pain medications   Problem: Tissue Perfusion: Goal: Risk factors for ineffective tissue perfusion will decrease Outcome: Progressing Remains on Heparin gtt, lower extrem dopplers pending  Problem: Consults Goal: Diagnosis - Venous Thromboembolism (VTE) Choose a selection Outcome: Progressing PE (Pulmonary Embolism) Goal: Pharmacy Consult for anticoagulation Outcome: Progressing Heparin gtt ordered

## 2017-07-21 NOTE — Plan of Care (Signed)
Problem: Education: Goal: Knowledge of [facility] Education information/materials will improve Outcome: Progressing Pt admitted from ED with bilateral PE's, currently on heparin gtt @ 59mL/hr. Treated for pain twice, with relief. Independent in room.

## 2017-07-21 NOTE — Progress Notes (Signed)
PHARMACIST - PHYSICIAN ORDER COMMUNICATION  CONCERNING: P&T Medication Policy on Herbal Medications  DESCRIPTION:  This patient's order for:  Glucosamine-chondroitin  has been noted.  This product(s) is classified as an "herbal" or natural product. Due to a lack of definitive safety studies or FDA approval, nonstandard manufacturing practices, plus the potential risk of unknown drug-drug interactions while on inpatient medications, the Pharmacy and Therapeutics Committee does not permit the use of "herbal" or natural products of this type within [place].   ACTION TAKEN: The pharmacy department is unable to verify this order at this time  Please reevaluate patient's clinical condition at discharge and address if the herbal or natural product(s) should be resumed at that time.    

## 2017-07-21 NOTE — Progress Notes (Signed)
To ultrasound via bed 

## 2017-07-21 NOTE — Discharge Summary (Signed)
[facility] NAME: Darin Fisher    MR#:  [id number]  DATE OF BIRTH:  October 11, 1945  DATE OF ADMISSION:  07/20/2017 ADMITTING PHYSICIAN: Demetrios Loll, MD  DATE OF DISCHARGE: 07/21/17  PRIMARY CARE PHYSICIAN: Tracie Harrier, MD    ADMISSION DIAGNOSIS:  RUQ pain [R10.11] Other acute pulmonary embolism without acute cor pulmonale (HCC) [I26.99]  DISCHARGE DIAGNOSIS:  Bilateral non occlusive PE Left LE DVT ( left popliteal/left saphenous vein)  SECONDARY DIAGNOSIS:   Past Medical History:  Diagnosis Date  . Asthma   . Cancer ([place])   . HTN (hypertension)   . Hypercholesterolemia   . Prostate CA [facility])     HOSPITAL COURSE:   Darin Fisher  is a 71 y.o. male with a known history of prostate cancer, hypertension, hyperlipidemia and asthma. The patient presented to the ED with Right lateral chest wall and the right upper quadrant worsening pain for 2 weeks  * Acute/subacute Bilateral  PULMONARY EMBOLISM--nonocclusive. Etio unclear -was on IV heparin gtt---po eliquis -Echo today -Left LE DVT-unprovoked. Travelled to beach >3 weeks ago. Leg cramping about a week ago -h/o prostate cancer--s/p surgery. Follows with Dr cope. PSA ok -right sided CP much improved. sats >92% on RA  *Hypertension. Continue home hypertension medication.  *Hyperlipidemia. Continue Zocor.  *incidental right lower pole kidney exophytic mass---defer to PCP for further f/u as out pt  Pt ok to go home. Explained not to do vigorous activity for few days. Wife voiced understanding D/c home  CONSULTS OBTAINED:    DRUG ALLERGIES:  No Known Allergies  DISCHARGE MEDICATIONS:   Current Discharge Medication List    START taking these medications   Details  apixaban (ELIQUIS) 5 MG TABS tablet Take 2 tabs (10 mg ) two times a day till 07/27/17 and then from 07/28/17 start 1 tab (5 mg) twice a day Qty: 60 tablet, Refills: 1      CONTINUE these  medications which have NOT CHANGED   Details  aspirin EC 81 MG tablet Take 81 mg by mouth daily.    dorzolamide (TRUSOPT) 2 % ophthalmic solution Place 1 drop into the left eye 2 (two) times daily.     glucosamine-chondroitin 500-400 MG tablet Take 1 tablet by mouth 3 (three) times daily.    Ipratropium-Albuterol (COMBIVENT IN) Inhale 2 puffs into the lungs every 6 (six) hours as needed.    latanoprost (XALATAN) 0.005 % ophthalmic solution Place 1 drop into both eyes at bedtime.    lisinopril (PRINIVIL,ZESTRIL) 20 MG tablet Take 20 mg by mouth daily.    Multiple Vitamin (MULTI-VITAMINS) TABS Take 1 tablet by mouth daily.    Omega-3 1000 MG CAPS Take 1 g by mouth daily.    simvastatin (ZOCOR) 20 MG tablet Take 20 mg by mouth daily.        If you experience worsening of your admission symptoms, develop shortness of breath, life threatening emergency, suicidal or homicidal thoughts you must seek medical attention immediately by calling 911 or calling your MD immediately  if symptoms less severe.  You Must read complete instructions/literature along with all the possible adverse reactions/side effects for all the Medicines you take and that have been prescribed to you. Take any new Medicines after you have completely understood and accept all the possible adverse reactions/side effects.   Please note  You were cared for by a hospitalist during your hospital stay. If you have any questions about your discharge medications or  the care you received while you were in the hospital after you are discharged, you can call the unit and asked to speak with the hospitalist on call if the hospitalist that took care of you is not available. Once you are discharged, your primary care physician will handle any further medical issues. Please note that NO REFILLS for any discharge medications will be authorized once you are discharged, as it is imperative that you return to your primary care physician (or  establish a relationship with a primary care physician if you do not have one) for your aftercare needs so that they can reassess your need for medications and monitor your lab values. Today   SUBJECTIVE   Right lateral chest wall pain better  VITAL SIGNS:  Blood pressure (!) 168/91, pulse 62, temperature 98.1 F (36.7 C), temperature source Oral, resp. rate 16, height 5\' 10"  (1.778 m), weight 104.4 kg (230 lb 3.2 oz), SpO2 96 %.  I/O:   Intake/Output Summary (Last 24 hours) at 07/21/17 1320 Last data filed at 07/21/17 1317  Gross per 24 hour  Intake            671.6 ml  Output             1450 ml  Net           -778.4 ml    PHYSICAL EXAMINATION:  GENERAL:  71 y.o.-year-old patient lying in the bed with no acute distress. obese EYES: Pupils equal, round, reactive to light and accommodation. No scleral icterus. Extraocular muscles intact.  HEENT: Head atraumatic, normocephalic. Oropharynx and nasopharynx clear.  NECK:  Supple, no jugular venous distention. No thyroid enlargement, no tenderness.  LUNGS: Normal breath sounds bilaterally, no wheezing, rales,rhonchi or crepitation. No use of accessory muscles of respiration.  CARDIOVASCULAR: S1, S2 normal. No murmurs, rubs, or gallops.  ABDOMEN: Soft, non-tender, non-distended. Bowel sounds present. No organomegaly or mass.  EXTREMITIES: No pedal edema, cyanosis, or clubbing.  NEUROLOGIC: Cranial nerves II through XII are intact. Muscle strength 5/5 in all extremities. Sensation intact. Gait not checked.  PSYCHIATRIC: The patient is alert and oriented x 3.  SKIN: No obvious rash, lesion, or ulcer.   DATA REVIEW:   CBC   Recent Labs Lab 07/21/17 0709  WBC 7.2  HGB 14.5  HCT 42.5  PLT 241    Chemistries   Recent Labs Lab 07/20/17 1919 07/21/17 0709  NA 137 135  K 4.5 4.1  CL 102 103  CO2 27 24  GLUCOSE 107* 96  BUN 17 17  CREATININE 1.30* 1.42*  CALCIUM 10.0 9.1  AST 22  --   ALT 22  --   ALKPHOS 70  --    BILITOT 0.7  --     Microbiology Results   No results found for this or any previous visit (from the past 240 hour(s)).  RADIOLOGY:  Dg Chest 2 View  Result Date: 07/20/2017 CLINICAL DATA:  Right-sided upper abdominal pain x2 weeks. EXAM: CHEST  2 VIEW COMPARISON:  None. FINDINGS: The heart size and mediastinal contours are within normal limits. An azygos fissure is of incidental note. Both lungs are clear. There is minimal blunting the right posterior and lateral costophrenic angles may represent a tiny effusion. The visualized skeletal structures are unremarkable. IMPRESSION: No active cardiopulmonary disease. Minimal blunting of the right posterior and lateral costophrenic angles may reflect a tiny effusion or pleural thickening. Electronically Signed   By: Ashley Royalty M.D.   On: 07/20/2017 20:03  Ct Angio Chest Pe W And/or Wo Contrast  Addendum Date: 07/21/2017   ADDENDUM REPORT: 07/20/2017 23:48 EXAM: CT renal stone protocol TECHNIQUE: Multi detector CT imaging of the abdomen pelvis performed without intravenous contrast. Multi planar CT image reconstructions were also obtained. COMPARISON:  None FINDINGS: Lower chest: Patchy ground-glass density in the posterior right lower lobe and lateral peripheral right lower lobe. Coronary artery calcification. Borderline to mild cardiomegaly. Hepatobiliary: Negative for calcified gallstones or biliary dilatation. No focal hepatic abnormality. Pancreas:  Negative for inflammation or ductal dilatation Spleen:  Normal in size and appearance.  Granuloma. Adrenals and kidneys: Adrenal glands are within normal limits. No hydronephrosis or ureteral stone. Exophytic 12 mm intermediate density lesion mid to lower pole right kidney. Bladder unremarkable. Stomach, small bowel, colon: The stomach is nonenlarged. There is no dilated small bowel. No colon wall thickening. Normal appendix. Reproductive:  Surgical clip in the pelvis.  No masses. Other: Negative for  free air or free fluid. Small fat in the inguinal canals. Musculoskeletal: Negative for acute or suspicious bone lesion. Grade 1 anterolisthesis of L4 on L5. IMPRESSION: 1. Negative for nephrolithiasis, hydronephrosis or ureteral stone 2. 12 mm exophytic intermediate density lesion lower pole right kidney, suggest nonemergent renal CT or MRI for further evaluation. Electronically Signed   By: Donavan Foil M.D.   On: 07/20/2017 23:48   Result Date: 07/20/2017 CLINICAL DATA:  Chest pain, right lower chest pain EXAM: CT ANGIOGRAPHY CHEST WITH CONTRAST TECHNIQUE: Multidetector CT imaging of the chest was performed using the standard protocol during bolus administration of intravenous contrast. Multiplanar CT image reconstructions and MIPs were obtained to evaluate the vascular anatomy. CONTRAST:  75 mL Isovue 370 intravenous COMPARISON:  Radiograph 07/20/2017 FINDINGS: Cardiovascular: Satisfactory opacification of the pulmonary arteries to the segmental level. Multiple bilateral nonocclusive filling defects visualize within lobar, segmental and subsegmental branches of the left lower and upper lobes, segmental branches of the right middle lobe, and segmental and subsegmental branches of the right lower lobe. RV LV ratio is slightly elevated at 0.97. 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 No dissection. Atherosclerotic calcifications. Azygos lobe. Mild coronary calcification. Mild cardiomegaly. No pericardial effusion Mediastinum/Nodes: No enlarged mediastinal, hilar, or axillary lymph nodes. Thyroid gland, trachea, and esophagus demonstrate no significant findings. Lungs/Pleura: Mild ground-glass density within the right lower lobe posteriorly and laterally which may reflect pulmonary infarct. No effusion. No pneumothorax Upper Abdomen: Reflux of contrast into the hepatic veins consistent with elevated right heart pressure. No acute abnormality. Splenic granuloma. Musculoskeletal:  Degenerative changes. No acute or suspicious bone lesion Review of the MIP images confirms the above findings. IMPRESSION: Positive for acute bilateral nonocclusive pulmonary emboli within the bilateral lower lobes, right middle lobe and left upper lobe. Positive for acute PE with CT evidence of right heart strain (RV/LV Ratio = 0.97) consistent with at least submassive (intermediate risk) PE. The presence of right heart strain has been associated with an increased risk of morbidity and mortality. Please activate Code PE by paging [phone]. Ground-glass density within the peripheral and posterior right lower lobe suspicious for pulmonary infarct. 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 Recommend annual imaging followup by CTA or MRA. This recommendation follows 2010 ACCF/AHA/AATS/ACR/ASA/SCA/SCAI/SIR/STS/SVM Guidelines for the Diagnosis and Management of Patients with Thoracic Aortic Disease. Circulation. 2010; 121: G500-B704 Critical Value/emergent results were called by telephone at the time of interpretation on 07/20/2017 at 11:37 pm to Dr. Eula Listen , who verbally acknowledged  these results. Aortic Atherosclerosis (ICD10-I70.0). Electronically Signed: By: Donavan Foil M.D. On: 07/20/2017 23:38   US Venous Img Lower Bilateral  Result Date: 07/21/2017 CLINICAL DATA:  Known pulmonary emboli EXAM: BILATERAL LOWER EXTREMITY VENOUS DOPPLER ULTRASOUND TECHNIQUE: Gray-scale sonography with graded compression, as well as color Doppler and duplex ultrasound were performed to evaluate the lower extremity deep venous systems from the level of the common femoral vein and including the common femoral, femoral, profunda femoral, popliteal and calf veins including the posterior tibial, peroneal and gastrocnemius veins when visible. The superficial great saphenous vein was also interrogated. Spectral Doppler was utilized to evaluate flow at rest and with distal augmentation maneuvers  in the common femoral, femoral and popliteal veins. COMPARISON:  None. FINDINGS: RIGHT LOWER EXTREMITY Common Femoral Vein: No evidence of thrombus. Normal compressibility, respiratory phasicity and response to augmentation. Saphenofemoral Junction: No evidence of thrombus. Normal compressibility and flow on color Doppler imaging. Profunda Femoral Vein: No evidence of thrombus. Normal compressibility and flow on color Doppler imaging. Femoral Vein: No evidence of thrombus. Normal compressibility, respiratory phasicity and response to augmentation. Popliteal Vein: No evidence of thrombus. Normal compressibility, respiratory phasicity and response to augmentation. Calf Veins: No evidence of thrombus. Normal compressibility and flow on color Doppler imaging. Superficial Great Saphenous Vein: No evidence of thrombus. Normal compressibility. Venous Reflux:  None. Other Findings:  None. LEFT LOWER EXTREMITY Common Femoral Vein: No evidence of thrombus. Normal compressibility, respiratory phasicity and response to augmentation. Saphenofemoral Junction: No evidence of thrombus. Normal compressibility and flow on color Doppler imaging. Profunda Femoral Vein: No evidence of thrombus. Normal compressibility and flow on color Doppler imaging. Femoral Vein: No evidence of thrombus. Normal compressibility, respiratory phasicity and response to augmentation. Popliteal Vein: Thrombus is noted with decreased compressibility. Calf Veins: No evidence of thrombus. Normal compressibility and flow on color Doppler imaging. Superficial Great Saphenous Vein: No evidence of thrombus. Normal compressibility. Venous Reflux:  None. Other Findings: Lesser saphenous vein thrombus is noted on the left as well. IMPRESSION: No evidence of deep venous thrombosis on the right. Nonocclusive thrombus is noted within the left popliteal and left lesser saphenous veins. Electronically Signed   By: Inez Catalina M.D.   On: 07/21/2017 10:42   Ct Renal  Stone Study  Addendum Date: 07/21/2017   ADDENDUM REPORT: 07/20/2017 23:48 EXAM: CT renal stone protocol TECHNIQUE: Multi detector CT imaging of the abdomen pelvis performed without intravenous contrast. Multi planar CT image reconstructions were also obtained. COMPARISON:  None FINDINGS: Lower chest: Patchy ground-glass density in the posterior right lower lobe and lateral peripheral right lower lobe. Coronary artery calcification. Borderline to mild cardiomegaly. Hepatobiliary: Negative for calcified gallstones or biliary dilatation. No focal hepatic abnormality. Pancreas:  Negative for inflammation or ductal dilatation Spleen:  Normal in size and appearance.  Granuloma. Adrenals and kidneys: Adrenal glands are within normal limits. No hydronephrosis or ureteral stone. Exophytic 12 mm intermediate density lesion mid to lower pole right kidney. Bladder unremarkable. Stomach, small bowel, colon: The stomach is nonenlarged. There is no dilated small bowel. No colon wall thickening. Normal appendix. Reproductive:  Surgical clip in the pelvis.  No masses. Other: Negative for free air or free fluid. Small fat in the inguinal canals. Musculoskeletal: Negative for acute or suspicious bone lesion. Grade 1 anterolisthesis of L4 on L5. IMPRESSION: 1. Negative for nephrolithiasis, hydronephrosis or ureteral stone 2. 12 mm exophytic intermediate density lesion lower pole right kidney, suggest nonemergent renal CT or MRI for further evaluation. Electronically Signed  By: Donavan Foil M.D.   On: 07/20/2017 23:48   Result Date: 07/20/2017 CLINICAL DATA:  Chest pain, right lower chest pain EXAM: CT ANGIOGRAPHY CHEST WITH CONTRAST TECHNIQUE: Multidetector CT imaging of the chest was performed using the standard protocol during bolus administration of intravenous contrast. Multiplanar CT image reconstructions and MIPs were obtained to evaluate the vascular anatomy. CONTRAST:  75 mL Isovue 370 intravenous COMPARISON:   Radiograph 07/20/2017 FINDINGS: Cardiovascular: Satisfactory opacification of the pulmonary arteries to the segmental level. Multiple bilateral nonocclusive filling defects visualize within lobar, segmental and subsegmental branches of the left lower and upper lobes, segmental branches of the right middle lobe, and segmental and subsegmental branches of the right lower lobe. RV LV ratio is slightly elevated at 0.97. 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 No dissection. Atherosclerotic calcifications. Azygos lobe. Mild coronary calcification. Mild cardiomegaly. No pericardial effusion Mediastinum/Nodes: No enlarged mediastinal, hilar, or axillary lymph nodes. Thyroid gland, trachea, and esophagus demonstrate no significant findings. Lungs/Pleura: Mild ground-glass density within the right lower lobe posteriorly and laterally which may reflect pulmonary infarct. No effusion. No pneumothorax Upper Abdomen: Reflux of contrast into the hepatic veins consistent with elevated right heart pressure. No acute abnormality. Splenic granuloma. Musculoskeletal: Degenerative changes. No acute or suspicious bone lesion Review of the MIP images confirms the above findings. IMPRESSION: Positive for acute bilateral nonocclusive pulmonary emboli within the bilateral lower lobes, right middle lobe and left upper lobe. Positive for acute PE with CT evidence of right heart strain (RV/LV Ratio = 0.97) consistent with at least submassive (intermediate risk) PE. The presence of right heart strain has been associated with an increased risk of morbidity and mortality. Please activate Code PE by paging [phone]. Ground-glass density within the peripheral and posterior right lower lobe suspicious for pulmonary infarct. 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 Recommend annual imaging followup by CTA or MRA. This recommendation follows 2010 ACCF/AHA/AATS/ACR/ASA/SCA/SCAI/SIR/STS/SVM Guidelines  for the Diagnosis and Management of Patients with Thoracic Aortic Disease. Circulation. 2010; 121: O160-V371 Critical Value/emergent results were called by telephone at the time of interpretation on 07/20/2017 at 11:37 pm to Dr. Eula Listen , who verbally acknowledged these results. Aortic Atherosclerosis (ICD10-I70.0). Electronically Signed: By: Donavan Foil M.D. On: 07/20/2017 23:38   US Abdomen Limited Ruq  Result Date: 07/20/2017 CLINICAL DATA:  Right upper quadrant pain times 12 hours. EXAM: ULTRASOUND ABDOMEN LIMITED RIGHT UPPER QUADRANT COMPARISON:  None. FINDINGS: Gallbladder: No gallstones or wall thickening visualized. No sonographic Murphy sign noted by sonographer. Common bile duct: Diameter: Normal at 3 mm Liver: No focal lesion identified. Slight increase in echogenicity of the liver parenchyma. Fisher vein is patent on color Doppler imaging with normal direction of blood flow towards the liver. IMPRESSION: No sonographic findings for the patient's pain. Slight increase in echogenicity of the liver parenchyma may represent fatty infiltration. Electronically Signed   By: Ashley Royalty M.D.   On: 07/20/2017 20:40     Management plans discussed with the patient, family and they are in agreement.  CODE STATUS:     Code Status Orders        Start     Ordered   07/21/17 0037  Full code  Continuous     07/21/17 0036    Code Status History    Date Active Date Inactive Code Status Order ID Comments User Context   This patient has a current code status but no historical code status.  Advance Directive Documentation     Most Recent Value  Type of Advance Directive  Healthcare Power of Attorney, Living will  Pre-existing out of facility DNR order (yellow form or pink MOST form)  -  "MOST" Form in Place?  -      TOTAL TIME TAKING CARE OF THIS PATIENT: *40* minutes.    Shivali Quackenbush M.D on 07/21/2017 at 1:20 PM  Between 7am to 6pm - Pager - [phone] After 6pm go to  [email or web address] - password [email or web address]  Office  [phone]  CC: Primary care physician; Tracie Harrier, MD

## 2017-07-21 NOTE — Care Management (Signed)
Mr. Dauphine will be going home on Eliquis. Will issue Free 30 day Trial Offer Shelbie Ammons RN MSN CCM Care Management [phone]

## 2017-07-21 NOTE — Progress Notes (Signed)
Back from ultrasound

## 2017-07-22 LAB — ECHOCARDIOGRAM COMPLETE
Height: 70 in
Weight: 3683.2 oz

## 2017-07-22 NOTE — Discharge Summary (Signed)
[name]    MR#:  [id number]  DATE OF BIRTH:  01-07-1946  DATE OF ADMISSION:  07/20/2017 ADMITTING PHYSICIAN: Demetrios Loll, MD  DATE OF DISCHARGE: 07/22/17  PRIMARY CARE PHYSICIAN: Tracie Harrier, MD    ADMISSION DIAGNOSIS:  RUQ pain [R10.11] Other acute pulmonary embolism without acute cor pulmonale (HCC) [I26.99]  DISCHARGE DIAGNOSIS:  Bilateral non occlusive PE Left LE DVT ( left popliteal/left saphenous vein)  SECONDARY DIAGNOSIS:   Past Medical History:  Diagnosis Date  . Asthma   . Cancer ([place])   . HTN (hypertension)   . Hypercholesterolemia   . Prostate CA [facility])     HOSPITAL COURSE:   Darin Fisher  is a 71 y.o. male with a known history of prostate cancer, hypertension, hyperlipidemia and asthma. The patient presented to the ED with Right lateral chest wall and the right upper quadrant worsening pain for 2 weeks  * Acute/subacute Bilateral  PULMONARY EMBOLISM--nonocclusive. Etio unclear -was on IV heparin gtt---po eliquis -Echo done--results pending -Left LE DVT-unprovoked. Travelled to beach >3 weeks ago. Leg cramping about a week ago -h/o prostate cancer--s/p surgery. Follows with Dr cope. PSA ok -right sided CP much improved. sats >92% on RA  *Hypertension. Continue home hypertension medication.  *Hyperlipidemia. Continue Zocor.  *incidental right lower pole kidney exophytic mass---defer to PCP for further f/u as out pt  Pt ok to go home. Explained not to do vigorous activity for few days.  D/c home  CONSULTS OBTAINED:    DRUG ALLERGIES:  No Known Allergies  DISCHARGE MEDICATIONS:   Current Discharge Medication List    START taking these medications   Details  apixaban (ELIQUIS) 5 MG TABS tablet Take 2 tabs (10 mg ) two times a day till 07/27/17 and then from 07/28/17 start 1 tab (5 mg) twice a day Qty: 60 tablet, Refills: 1      CONTINUE these  medications which have NOT CHANGED   Details  aspirin EC 81 MG tablet Take 81 mg by mouth daily.    dorzolamide (TRUSOPT) 2 % ophthalmic solution Place 1 drop into the left eye 2 (two) times daily.     glucosamine-chondroitin 500-400 MG tablet Take 1 tablet by mouth 3 (three) times daily.    Ipratropium-Albuterol (COMBIVENT IN) Inhale 2 puffs into the lungs every 6 (six) hours as needed.    latanoprost (XALATAN) 0.005 % ophthalmic solution Place 1 drop into both eyes at bedtime.    lisinopril (PRINIVIL,ZESTRIL) 20 MG tablet Take 20 mg by mouth daily.    Multiple Vitamin (MULTI-VITAMINS) TABS Take 1 tablet by mouth daily.    Omega-3 1000 MG CAPS Take 1 g by mouth daily.    simvastatin (ZOCOR) 20 MG tablet Take 20 mg by mouth daily.        If you experience worsening of your admission symptoms, develop shortness of breath, life threatening emergency, suicidal or homicidal thoughts you must seek medical attention immediately by calling 911 or calling your MD immediately  if symptoms less severe.  You Must read complete instructions/literature along with all the possible adverse reactions/side effects for all the Medicines you take and that have been prescribed to you. Take any new Medicines after you have completely understood and accept all the possible adverse reactions/side effects.   Please note  You were cared for by a hospitalist during your hospital stay. If you have any questions about your discharge medications or the  care you received while you were in the hospital after you are discharged, you can call the unit and asked to speak with the hospitalist on call if the hospitalist that took care of you is not available. Once you are discharged, your primary care physician will handle any further medical issues. Please note that NO REFILLS for any discharge medications will be authorized once you are discharged, as it is imperative that you return to your primary care physician (or  establish a relationship with a primary care physician if you do not have one) for your aftercare needs so that they can reassess your need for medications and monitor your lab values. Today   SUBJECTIVE   Right lateral chest wall pain better Uneventful night VITAL SIGNS:  Blood pressure (!) 163/85, pulse (!) 59, temperature 97.7 F (36.5 C), temperature source Oral, resp. rate 17, height 5\' 10"  (1.778 m), weight 104.4 kg (230 lb 3.2 oz), SpO2 95 %.  I/O:    Intake/Output Summary (Last 24 hours) at 07/22/17 0743 Last data filed at 07/22/17 0500  Gross per 24 hour  Intake           812.07 ml  Output             1250 ml  Net          -437.93 ml    PHYSICAL EXAMINATION:  GENERAL:  71 y.o.-year-old patient lying in the bed with no acute distress. obese EYES: Pupils equal, round, reactive to light and accommodation. No scleral icterus. Extraocular muscles intact.  HEENT: Head atraumatic, normocephalic. Oropharynx and nasopharynx clear.  NECK:  Supple, no jugular venous distention. No thyroid enlargement, no tenderness.  LUNGS: Normal breath sounds bilaterally, no wheezing, rales,rhonchi or crepitation. No use of accessory muscles of respiration.  CARDIOVASCULAR: S1, S2 normal. No murmurs, rubs, or gallops.  ABDOMEN: Soft, non-tender, non-distended. Bowel sounds present. No organomegaly or mass.  EXTREMITIES: No pedal edema, cyanosis, or clubbing.  NEUROLOGIC: Cranial nerves II through XII are intact. Muscle strength 5/5 in all extremities. Sensation intact. Gait not checked.  PSYCHIATRIC: The patient is alert and oriented x 3.  SKIN: No obvious rash, lesion, or ulcer.   DATA REVIEW:   CBC   Recent Labs Lab 07/21/17 0709  WBC 7.2  HGB 14.5  HCT 42.5  PLT 241    Chemistries   Recent Labs Lab 07/20/17 1919 07/21/17 0709  NA 137 135  K 4.5 4.1  CL 102 103  CO2 27 24  GLUCOSE 107* 96  BUN 17 17  CREATININE 1.30* 1.42*  CALCIUM 10.0 9.1  AST 22  --   ALT 22  --    ALKPHOS 70  --   BILITOT 0.7  --     Microbiology Results   No results found for this or any previous visit (from the past 240 hour(s)).  RADIOLOGY:  Dg Chest 2 View  Result Date: 07/20/2017 CLINICAL DATA:  Right-sided upper abdominal pain x2 weeks. EXAM: CHEST  2 VIEW COMPARISON:  None. FINDINGS: The heart size and mediastinal contours are within normal limits. An azygos fissure is of incidental note. Both lungs are clear. There is minimal blunting the right posterior and lateral costophrenic angles may represent a tiny effusion. The visualized skeletal structures are unremarkable. IMPRESSION: No active cardiopulmonary disease. Minimal blunting of the right posterior and lateral costophrenic angles may reflect a tiny effusion or pleural thickening. Electronically Signed   By: Ashley Royalty M.D.   On: 07/20/2017 20:03  Ct Angio Chest Pe W And/or Wo Contrast  Addendum Date: 07/21/2017   ADDENDUM REPORT: 07/20/2017 23:48 EXAM: CT renal stone protocol TECHNIQUE: Multi detector CT imaging of the abdomen pelvis performed without intravenous contrast. Multi planar CT image reconstructions were also obtained. COMPARISON:  None FINDINGS: Lower chest: Patchy ground-glass density in the posterior right lower lobe and lateral peripheral right lower lobe. Coronary artery calcification. Borderline to mild cardiomegaly. Hepatobiliary: Negative for calcified gallstones or biliary dilatation. No focal hepatic abnormality. Pancreas:  Negative for inflammation or ductal dilatation Spleen:  Normal in size and appearance.  Granuloma. Adrenals and kidneys: Adrenal glands are within normal limits. No hydronephrosis or ureteral stone. Exophytic 12 mm intermediate density lesion mid to lower pole right kidney. Bladder unremarkable. Stomach, small bowel, colon: The stomach is nonenlarged. There is no dilated small bowel. No colon wall thickening. Normal appendix. Reproductive:  Surgical clip in the pelvis.  No masses.  Other: Negative for free air or free fluid. Small fat in the inguinal canals. Musculoskeletal: Negative for acute or suspicious bone lesion. Grade 1 anterolisthesis of L4 on L5. IMPRESSION: 1. Negative for nephrolithiasis, hydronephrosis or ureteral stone 2. 12 mm exophytic intermediate density lesion lower pole right kidney, suggest nonemergent renal CT or MRI for further evaluation. Electronically Signed   By: Donavan Foil M.D.   On: 07/20/2017 23:48   Result Date: 07/20/2017 CLINICAL DATA:  Chest pain, right lower chest pain EXAM: CT ANGIOGRAPHY CHEST WITH CONTRAST TECHNIQUE: Multidetector CT imaging of the chest was performed using the standard protocol during bolus administration of intravenous contrast. Multiplanar CT image reconstructions and MIPs were obtained to evaluate the vascular anatomy. CONTRAST:  75 mL Isovue 370 intravenous COMPARISON:  Radiograph 07/20/2017 FINDINGS: Cardiovascular: Satisfactory opacification of the pulmonary arteries to the segmental level. Multiple bilateral nonocclusive filling defects visualize within lobar, segmental and subsegmental branches of the left lower and upper lobes, segmental branches of the right middle lobe, and segmental and subsegmental branches of the right lower lobe. RV LV ratio is slightly elevated at 0.97. 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 No dissection. Atherosclerotic calcifications. Azygos lobe. Mild coronary calcification. Mild cardiomegaly. No pericardial effusion Mediastinum/Nodes: No enlarged mediastinal, hilar, or axillary lymph nodes. Thyroid gland, trachea, and esophagus demonstrate no significant findings. Lungs/Pleura: Mild ground-glass density within the right lower lobe posteriorly and laterally which may reflect pulmonary infarct. No effusion. No pneumothorax Upper Abdomen: Reflux of contrast into the hepatic veins consistent with elevated right heart pressure. No acute abnormality. Splenic granuloma.  Musculoskeletal: Degenerative changes. No acute or suspicious bone lesion Review of the MIP images confirms the above findings. IMPRESSION: Positive for acute bilateral nonocclusive pulmonary emboli within the bilateral lower lobes, right middle lobe and left upper lobe. Positive for acute PE with CT evidence of right heart strain (RV/LV Ratio = 0.97) consistent with at least submassive (intermediate risk) PE. The presence of right heart strain has been associated with an increased risk of morbidity and mortality. Please activate Code PE by paging [phone]. Ground-glass density within the peripheral and posterior right lower lobe suspicious for pulmonary infarct. 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 Recommend annual imaging followup by CTA or MRA. This recommendation follows 2010 ACCF/AHA/AATS/ACR/ASA/SCA/SCAI/SIR/STS/SVM Guidelines for the Diagnosis and Management of Patients with Thoracic Aortic Disease. Circulation. 2010; 121: F621-H086 Critical Value/emergent results were called by telephone at the time of interpretation on 07/20/2017 at 11:37 pm to Dr. Eula Listen , who verbally acknowledged  these results. Aortic Atherosclerosis (ICD10-I70.0). Electronically Signed: By: Donavan Foil M.D. On: 07/20/2017 23:38   US Venous Img Lower Bilateral  Result Date: 07/21/2017 CLINICAL DATA:  Known pulmonary emboli EXAM: BILATERAL LOWER EXTREMITY VENOUS DOPPLER ULTRASOUND TECHNIQUE: Gray-scale sonography with graded compression, as well as color Doppler and duplex ultrasound were performed to evaluate the lower extremity deep venous systems from the level of the common femoral vein and including the common femoral, femoral, profunda femoral, popliteal and calf veins including the posterior tibial, peroneal and gastrocnemius veins when visible. The superficial great saphenous vein was also interrogated. Spectral Doppler was utilized to evaluate flow at rest and with distal  augmentation maneuvers in the common femoral, femoral and popliteal veins. COMPARISON:  None. FINDINGS: RIGHT LOWER EXTREMITY Common Femoral Vein: No evidence of thrombus. Normal compressibility, respiratory phasicity and response to augmentation. Saphenofemoral Junction: No evidence of thrombus. Normal compressibility and flow on color Doppler imaging. Profunda Femoral Vein: No evidence of thrombus. Normal compressibility and flow on color Doppler imaging. Femoral Vein: No evidence of thrombus. Normal compressibility, respiratory phasicity and response to augmentation. Popliteal Vein: No evidence of thrombus. Normal compressibility, respiratory phasicity and response to augmentation. Calf Veins: No evidence of thrombus. Normal compressibility and flow on color Doppler imaging. Superficial Great Saphenous Vein: No evidence of thrombus. Normal compressibility. Venous Reflux:  None. Other Findings:  None. LEFT LOWER EXTREMITY Common Femoral Vein: No evidence of thrombus. Normal compressibility, respiratory phasicity and response to augmentation. Saphenofemoral Junction: No evidence of thrombus. Normal compressibility and flow on color Doppler imaging. Profunda Femoral Vein: No evidence of thrombus. Normal compressibility and flow on color Doppler imaging. Femoral Vein: No evidence of thrombus. Normal compressibility, respiratory phasicity and response to augmentation. Popliteal Vein: Thrombus is noted with decreased compressibility. Calf Veins: No evidence of thrombus. Normal compressibility and flow on color Doppler imaging. Superficial Great Saphenous Vein: No evidence of thrombus. Normal compressibility. Venous Reflux:  None. Other Findings: Lesser saphenous vein thrombus is noted on the left as well. IMPRESSION: No evidence of deep venous thrombosis on the right. Nonocclusive thrombus is noted within the left popliteal and left lesser saphenous veins. Electronically Signed   By: Inez Catalina M.D.   On: 07/21/2017  10:42   Ct Renal Stone Study  Addendum Date: 07/21/2017   ADDENDUM REPORT: 07/20/2017 23:48 EXAM: CT renal stone protocol TECHNIQUE: Multi detector CT imaging of the abdomen pelvis performed without intravenous contrast. Multi planar CT image reconstructions were also obtained. COMPARISON:  None FINDINGS: Lower chest: Patchy ground-glass density in the posterior right lower lobe and lateral peripheral right lower lobe. Coronary artery calcification. Borderline to mild cardiomegaly. Hepatobiliary: Negative for calcified gallstones or biliary dilatation. No focal hepatic abnormality. Pancreas:  Negative for inflammation or ductal dilatation Spleen:  Normal in size and appearance.  Granuloma. Adrenals and kidneys: Adrenal glands are within normal limits. No hydronephrosis or ureteral stone. Exophytic 12 mm intermediate density lesion mid to lower pole right kidney. Bladder unremarkable. Stomach, small bowel, colon: The stomach is nonenlarged. There is no dilated small bowel. No colon wall thickening. Normal appendix. Reproductive:  Surgical clip in the pelvis.  No masses. Other: Negative for free air or free fluid. Small fat in the inguinal canals. Musculoskeletal: Negative for acute or suspicious bone lesion. Grade 1 anterolisthesis of L4 on L5. IMPRESSION: 1. Negative for nephrolithiasis, hydronephrosis or ureteral stone 2. 12 mm exophytic intermediate density lesion lower pole right kidney, suggest nonemergent renal CT or MRI for further evaluation. Electronically Signed  By: Donavan Foil M.D.   On: 07/20/2017 23:48   Result Date: 07/20/2017 CLINICAL DATA:  Chest pain, right lower chest pain EXAM: CT ANGIOGRAPHY CHEST WITH CONTRAST TECHNIQUE: Multidetector CT imaging of the chest was performed using the standard protocol during bolus administration of intravenous contrast. Multiplanar CT image reconstructions and MIPs were obtained to evaluate the vascular anatomy. CONTRAST:  75 mL Isovue 370 intravenous  COMPARISON:  Radiograph 07/20/2017 FINDINGS: Cardiovascular: Satisfactory opacification of the pulmonary arteries to the segmental level. Multiple bilateral nonocclusive filling defects visualize within lobar, segmental and subsegmental branches of the left lower and upper lobes, segmental branches of the right middle lobe, and segmental and subsegmental branches of the right lower lobe. RV LV ratio is slightly elevated at 0.97. 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 No dissection. Atherosclerotic calcifications. Azygos lobe. Mild coronary calcification. Mild cardiomegaly. No pericardial effusion Mediastinum/Nodes: No enlarged mediastinal, hilar, or axillary lymph nodes. Thyroid gland, trachea, and esophagus demonstrate no significant findings. Lungs/Pleura: Mild ground-glass density within the right lower lobe posteriorly and laterally which may reflect pulmonary infarct. No effusion. No pneumothorax Upper Abdomen: Reflux of contrast into the hepatic veins consistent with elevated right heart pressure. No acute abnormality. Splenic granuloma. Musculoskeletal: Degenerative changes. No acute or suspicious bone lesion Review of the MIP images confirms the above findings. IMPRESSION: Positive for acute bilateral nonocclusive pulmonary emboli within the bilateral lower lobes, right middle lobe and left upper lobe. Positive for acute PE with CT evidence of right heart strain (RV/LV Ratio = 0.97) consistent with at least submassive (intermediate risk) PE. The presence of right heart strain has been associated with an increased risk of morbidity and mortality. Please activate Code PE by paging [phone]. Ground-glass density within the peripheral and posterior right lower lobe suspicious for pulmonary infarct. 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 Recommend annual imaging followup by CTA or MRA. This recommendation follows 2010 ACCF/AHA/AATS/ACR/ASA/SCA/SCAI/SIR/STS/SVM  Guidelines for the Diagnosis and Management of Patients with Thoracic Aortic Disease. Circulation. 2010; 121: E332-R518 Critical Value/emergent results were called by telephone at the time of interpretation on 07/20/2017 at 11:37 pm to Dr. Eula Listen , who verbally acknowledged these results. Aortic Atherosclerosis (ICD10-I70.0). Electronically Signed: By: Donavan Foil M.D. On: 07/20/2017 23:38   US Abdomen Limited Ruq  Result Date: 07/20/2017 CLINICAL DATA:  Right upper quadrant pain times 12 hours. EXAM: ULTRASOUND ABDOMEN LIMITED RIGHT UPPER QUADRANT COMPARISON:  None. FINDINGS: Gallbladder: No gallstones or wall thickening visualized. No sonographic Murphy sign noted by sonographer. Common bile duct: Diameter: Normal at 3 mm Liver: No focal lesion identified. Slight increase in echogenicity of the liver parenchyma. Portal vein is patent on color Doppler imaging with normal direction of blood flow towards the liver. IMPRESSION: No sonographic findings for the patient's pain. Slight increase in echogenicity of the liver parenchyma may represent fatty infiltration. Electronically Signed   By: Ashley Royalty M.D.   On: 07/20/2017 20:40     Management plans discussed with the patient, family and they are in agreement.  CODE STATUS:     Code Status Orders        Start     Ordered   07/21/17 0037  Full code  Continuous     07/21/17 0036    Code Status History    Date Active Date Inactive Code Status Order ID Comments User Context   This patient has a current code status but no historical code status.  Advance Directive Documentation     Most Recent Value  Type of Advance Directive  Healthcare Power of Attorney, Living will  Pre-existing out of facility DNR order (yellow form or pink MOST form)  -  "MOST" Form in Place?  -      TOTAL TIME TAKING CARE OF THIS PATIENT: *40* minutes.    Tamaj Jurgens M.D on 07/22/2017 at 7:43 AM  Between 7am to 6pm - Pager - [phone] After  6pm go to [email or web address] - password [email or web address]  Office  [phone]  CC: Primary care physician; Tracie Harrier, MD

## 2017-07-22 NOTE — Progress Notes (Signed)
IVs and tele removed from patient. Discharge instructions given to patient along with hard copy prescription and patient's eyedrops. Verbalized understanding. No distress at this time. Family is at bedside and will be transporting patient home.

## 2018-10-05 ENCOUNTER — Other Ambulatory Visit: Payer: Self-pay | Admitting: Internal Medicine

## 2018-10-05 DIAGNOSIS — I77810 Thoracic aortic ectasia: Secondary | ICD-10-CM

## 2018-10-05 DIAGNOSIS — Z0000 Encounter for general adult medical examination without abnormal findings: Secondary | ICD-10-CM

## 2018-10-13 ENCOUNTER — Ambulatory Visit
Admission: RE | Admit: 2018-10-13 | Discharge: 2018-10-13 | Disposition: A | Discharge status: Home / Self Care | Payer: Medicare Other | Source: Ambulatory Visit | Attending: Internal Medicine | Admitting: Internal Medicine

## 2018-10-13 DIAGNOSIS — I77810 Thoracic aortic ectasia: Secondary | ICD-10-CM | POA: Insufficient documentation

## 2018-10-13 DIAGNOSIS — Z0000 Encounter for general adult medical examination without abnormal findings: Secondary | ICD-10-CM | POA: Insufficient documentation

## 2018-10-13 MED ORDER — IOPAMIDOL (ISOVUE-370) INJECTION 76%
75.0000 mL | Freq: Once | INTRAVENOUS | Status: AC | PRN
  Administered 2018-10-13: 75 mL via INTRAVENOUS

## 2018-10-25 ENCOUNTER — Other Ambulatory Visit: Payer: Self-pay | Admitting: Internal Medicine

## 2018-10-25 DIAGNOSIS — I77810 Thoracic aortic ectasia: Secondary | ICD-10-CM

## 2020-01-25 ENCOUNTER — Other Ambulatory Visit: Payer: Self-pay | Admitting: Internal Medicine

## 2020-01-25 DIAGNOSIS — Z0000 Encounter for general adult medical examination without abnormal findings: Secondary | ICD-10-CM

## 2020-01-25 DIAGNOSIS — I77810 Thoracic aortic ectasia: Secondary | ICD-10-CM

## 2020-02-07 ENCOUNTER — Other Ambulatory Visit: Payer: Self-pay

## 2020-02-07 ENCOUNTER — Ambulatory Visit
Admission: RE | Admit: 2020-02-07 | Discharge: 2020-02-07 | Disposition: A | Discharge status: Home / Self Care | Payer: Medicare Other | Source: Ambulatory Visit | Attending: Internal Medicine | Admitting: Internal Medicine

## 2020-02-07 DIAGNOSIS — I77810 Thoracic aortic ectasia: Secondary | ICD-10-CM | POA: Diagnosis present

## 2020-02-07 DIAGNOSIS — Z0000 Encounter for general adult medical examination without abnormal findings: Secondary | ICD-10-CM

## 2020-02-07 LAB — POCT I-STAT CREATININE: Creatinine, Ser: 1.4 mg/dL — ABNORMAL HIGH (ref 0.61–1.24)

## 2020-02-07 MED ORDER — IOHEXOL 350 MG/ML SOLN
75.0000 mL | Freq: Once | INTRAVENOUS | Status: AC | PRN
  Administered 2020-02-07: 09:00:00 75 mL via INTRAVENOUS

## 2020-04-05 ENCOUNTER — Other Ambulatory Visit: Payer: Self-pay | Admitting: Orthopedic Surgery

## 2020-04-05 DIAGNOSIS — M25552 Pain in left hip: Secondary | ICD-10-CM

## 2020-04-05 DIAGNOSIS — R29898 Other symptoms and signs involving the musculoskeletal system: Secondary | ICD-10-CM

## 2020-04-23 ENCOUNTER — Other Ambulatory Visit: Payer: Self-pay

## 2020-04-23 ENCOUNTER — Ambulatory Visit
Admission: RE | Admit: 2020-04-23 | Discharge: 2020-04-23 | Disposition: A | Discharge status: Home / Self Care | Payer: Medicare Other | Source: Ambulatory Visit | Attending: Orthopedic Surgery | Admitting: Orthopedic Surgery

## 2020-04-23 DIAGNOSIS — R29898 Other symptoms and signs involving the musculoskeletal system: Secondary | ICD-10-CM | POA: Diagnosis not present

## 2020-04-23 DIAGNOSIS — M25552 Pain in left hip: Secondary | ICD-10-CM | POA: Diagnosis present

## 2020-09-10 ENCOUNTER — Other Ambulatory Visit
Admission: RE | Admit: 2020-09-10 | Discharge: 2020-09-10 | Disposition: A | Discharge status: Home / Self Care | Payer: Medicare Other | Source: Ambulatory Visit | Attending: Student | Admitting: Student

## 2020-09-10 DIAGNOSIS — R0789 Other chest pain: Secondary | ICD-10-CM | POA: Insufficient documentation

## 2020-09-10 LAB — FIBRIN DERIVATIVES D-DIMER (ARMC ONLY): Fibrin derivatives D-dimer (ARMC): 376.21 ng/mL (FEU) (ref 0.00–499.00)

## 2021-01-25 ENCOUNTER — Other Ambulatory Visit: Payer: Self-pay | Admitting: Internal Medicine

## 2021-01-25 DIAGNOSIS — I712 Thoracic aortic aneurysm, without rupture: Secondary | ICD-10-CM

## 2021-01-25 DIAGNOSIS — I7121 Aneurysm of the ascending aorta, without rupture: Secondary | ICD-10-CM

## 2021-01-25 DIAGNOSIS — I480 Paroxysmal atrial fibrillation: Secondary | ICD-10-CM

## 2021-03-20 ENCOUNTER — Ambulatory Visit
Admission: RE | Admit: 2021-03-20 | Discharge: 2021-03-20 | Disposition: A | Discharge status: Home / Self Care | Payer: Medicare Other | Source: Ambulatory Visit | Attending: Internal Medicine | Admitting: Internal Medicine

## 2021-03-20 ENCOUNTER — Other Ambulatory Visit: Payer: Self-pay

## 2021-03-20 DIAGNOSIS — I480 Paroxysmal atrial fibrillation: Secondary | ICD-10-CM | POA: Diagnosis present

## 2021-03-20 DIAGNOSIS — I712 Thoracic aortic aneurysm, without rupture: Secondary | ICD-10-CM | POA: Diagnosis not present

## 2021-03-20 DIAGNOSIS — I7121 Aneurysm of the ascending aorta, without rupture: Secondary | ICD-10-CM

## 2021-03-20 LAB — POCT I-STAT CREATININE: Creatinine, Ser: 1.3 mg/dL — ABNORMAL HIGH (ref 0.61–1.24)

## 2021-03-20 MED ORDER — IOHEXOL 350 MG/ML SOLN
100.0000 mL | Freq: Once | INTRAVENOUS | Status: AC | PRN
  Administered 2021-03-20: 80 mL via INTRAVENOUS

## 2021-08-02 ENCOUNTER — Other Ambulatory Visit: Payer: Self-pay | Admitting: Internal Medicine

## 2021-08-02 DIAGNOSIS — R809 Proteinuria, unspecified: Secondary | ICD-10-CM

## 2021-08-02 DIAGNOSIS — N1831 Chronic kidney disease, stage 3a: Secondary | ICD-10-CM

## 2021-08-06 ENCOUNTER — Ambulatory Visit
Admission: RE | Admit: 2021-08-06 | Discharge: 2021-08-06 | Disposition: A | Discharge status: Home / Self Care | Payer: Medicare Other | Source: Ambulatory Visit | Attending: Internal Medicine | Admitting: Internal Medicine

## 2021-08-06 ENCOUNTER — Other Ambulatory Visit: Payer: Self-pay

## 2021-08-06 DIAGNOSIS — N1831 Chronic kidney disease, stage 3a: Secondary | ICD-10-CM | POA: Diagnosis present

## 2021-08-06 DIAGNOSIS — R809 Proteinuria, unspecified: Secondary | ICD-10-CM | POA: Insufficient documentation

## 2021-08-07 ENCOUNTER — Other Ambulatory Visit: Payer: Self-pay | Admitting: Internal Medicine

## 2021-08-07 DIAGNOSIS — N2889 Other specified disorders of kidney and ureter: Secondary | ICD-10-CM

## 2021-08-15 ENCOUNTER — Ambulatory Visit
Admission: RE | Admit: 2021-08-15 | Discharge: 2021-08-15 | Disposition: A | Discharge status: Home / Self Care | Payer: Medicare Other | Source: Ambulatory Visit | Attending: Internal Medicine | Admitting: Internal Medicine

## 2021-08-15 DIAGNOSIS — N2889 Other specified disorders of kidney and ureter: Secondary | ICD-10-CM | POA: Diagnosis not present

## 2021-08-15 MED ORDER — GADOBUTROL 1 MMOL/ML IV SOLN
10.0000 mL | Freq: Once | INTRAVENOUS | Status: AC | PRN
  Administered 2021-08-15: 10 mL via INTRAVENOUS

## 2021-09-16 ENCOUNTER — Encounter (HOSPITAL_COMMUNITY): Payer: Self-pay | Admitting: Radiology

## 2021-09-23 ENCOUNTER — Encounter: Payer: Self-pay | Admitting: Urology

## 2021-09-23 ENCOUNTER — Ambulatory Visit (INDEPENDENT_AMBULATORY_CARE_PROVIDER_SITE_OTHER): Payer: Medicare Other | Admitting: Urology

## 2021-09-23 ENCOUNTER — Other Ambulatory Visit: Payer: Self-pay

## 2021-09-23 VITALS — BP 175/90 | HR 59 | Ht 70.0 in | Wt 218.0 lb

## 2021-09-23 DIAGNOSIS — N2889 Other specified disorders of kidney and ureter: Secondary | ICD-10-CM | POA: Diagnosis not present

## 2021-09-23 DIAGNOSIS — C61 Malignant neoplasm of prostate: Secondary | ICD-10-CM | POA: Diagnosis not present

## 2021-09-23 NOTE — Progress Notes (Signed)
° °  09/23/21 2:30 PM   Darin Fisher [birth year] [id number]  CC: Right renal mass, history of prostate cancer  HPI: 75 year old male with strong family history of prostate cancer who was reportedly treated with a prostatectomy by Dr. Jacqlyn Larsen in 2006, PSA remains undetectable.  He is referred for a small right renal mass seen on ultrasound for CKD that prompted a follow-up MRI abdomen.  This showed a 1.1 cm exophytic subtly enhancing mass at the right lower pole of the kidney, however this is unchanged in size from CT from 2018.  He denies any gross hematuria or problems since his prostatectomy.  PSA has remained undetectable.  Most recent renal function creatinine 1.5, EGFR 46.   PMH: Past Medical History:  Diagnosis Date   Asthma    Cancer ([place])    HTN (hypertension)    Hypercholesterolemia    Prostate CA ([place])     Family History: Family History  Problem Relation Age of Onset   Diabetes Mother    Breast cancer Mother    Heart disease Father    Skin cancer Father    Diabetes Sister    Diabetes Brother     Social History:  reports that he has never smoked. He has never used smokeless tobacco. He reports that he does not drink alcohol and does not use drugs.  Physical Exam: BP (!) 175/90 (BP Location: Left Arm, Patient Position: Sitting, Cuff Size: Large)    Pulse (!) 59    Ht _0  (1.778 m)    Wt 218 lb (98.9 kg)    BMI 31.28 kg/m    Constitutional:  Alert and oriented, No acute distress. Cardiovascular: No clubbing, cyanosis, or edema. Respiratory: Normal respiratory effort, no increased work of breathing. GI: Abdomen is soft, nontender, nondistended, no abdominal masses   Laboratory Data: Reviewed  Pertinent Imaging: I have personally viewed and interpreted the CT from 2018 as well as the most recent MRI from November 2022 showing a 1 cm exophytic subtly enhancing mass at the posterior right lower kidney, this is unchanged from CT in 2018 likely indicating a benign  process.  Assessment & Plan:   75 year old male with history of prostate cancer(pathology unavailable) in 2006 treated by Dr. Jacqlyn Larsen with prostatectomy whose PSA remains undetectable, as well as a small 1 cm right lower pole renal mass that is stable over the last 4 years, most likely indicating a benign process.  Reassurance was provided, and I recommended repeat MRI in 1 year, and if remains unchanged likely can discontinue surveillance imaging.  RTC 1 year with MRI prior Continue yearly PSA   Nickolas Madrid, MD 09/23/2021  [facility] [address], [place], [place] [address] [phone]

## 2022-04-14 NOTE — Addendum Note (Signed)
Encounter addended by: Annie Paras on: 04/14/2022 10:57 AM  Actions taken: Letter saved

## 2022-06-25 ENCOUNTER — Encounter: Payer: Self-pay | Admitting: Urology

## 2022-08-08 ENCOUNTER — Other Ambulatory Visit: Payer: Self-pay | Admitting: Internal Medicine

## 2022-08-08 DIAGNOSIS — N2889 Other specified disorders of kidney and ureter: Secondary | ICD-10-CM

## 2022-08-18 ENCOUNTER — Ambulatory Visit
Admission: RE | Admit: 2022-08-18 | Discharge: 2022-08-18 | Disposition: A | Discharge status: Home / Self Care | Payer: Medicare Other | Source: Ambulatory Visit | Attending: Internal Medicine | Admitting: Internal Medicine

## 2022-08-18 DIAGNOSIS — N2889 Other specified disorders of kidney and ureter: Secondary | ICD-10-CM | POA: Insufficient documentation

## 2022-08-18 MED ORDER — GADOBUTROL 1 MMOL/ML IV SOLN
10.0000 mL | Freq: Once | INTRAVENOUS | Status: AC | PRN
  Administered 2022-08-18: 10 mL via INTRAVENOUS

## 2022-08-25 ENCOUNTER — Inpatient Hospital Stay: Payer: Medicare Other | Attending: Internal Medicine | Admitting: Internal Medicine

## 2022-08-25 ENCOUNTER — Encounter: Payer: Self-pay | Admitting: Internal Medicine

## 2022-08-25 ENCOUNTER — Inpatient Hospital Stay: Payer: Medicare Other

## 2022-08-25 VITALS — BP 146/72 | HR 65 | Temp 98.6°F | Resp 20 | Wt 235.0 lb

## 2022-08-25 DIAGNOSIS — M899 Disorder of bone, unspecified: Secondary | ICD-10-CM | POA: Insufficient documentation

## 2022-08-25 DIAGNOSIS — Z803 Family history of malignant neoplasm of breast: Secondary | ICD-10-CM | POA: Insufficient documentation

## 2022-08-25 DIAGNOSIS — Z86718 Personal history of other venous thrombosis and embolism: Secondary | ICD-10-CM | POA: Insufficient documentation

## 2022-08-25 DIAGNOSIS — Z809 Family history of malignant neoplasm, unspecified: Secondary | ICD-10-CM | POA: Insufficient documentation

## 2022-08-25 DIAGNOSIS — Z8546 Personal history of malignant neoplasm of prostate: Secondary | ICD-10-CM | POA: Insufficient documentation

## 2022-08-25 DIAGNOSIS — Z808 Family history of malignant neoplasm of other organs or systems: Secondary | ICD-10-CM | POA: Insufficient documentation

## 2022-08-25 DIAGNOSIS — Z86711 Personal history of pulmonary embolism: Secondary | ICD-10-CM | POA: Insufficient documentation

## 2022-08-25 DIAGNOSIS — N289 Disorder of kidney and ureter, unspecified: Secondary | ICD-10-CM | POA: Insufficient documentation

## 2022-08-25 DIAGNOSIS — Z9079 Acquired absence of other genital organ(s): Secondary | ICD-10-CM | POA: Diagnosis not present

## 2022-08-25 DIAGNOSIS — R937 Abnormal findings on diagnostic imaging of other parts of musculoskeletal system: Secondary | ICD-10-CM

## 2022-08-25 LAB — PSA: Prostatic Specific Antigen: <0.01 ng/mL (ref 0.00–4.00)

## 2022-08-25 NOTE — Progress Notes (Signed)
Patient has no concerns at the moment.   Mom had breast cancer Father and 5 brothers have had prostate cancer.

## 2022-08-26 DIAGNOSIS — N289 Disorder of kidney and ureter, unspecified: Secondary | ICD-10-CM | POA: Insufficient documentation

## 2022-08-26 DIAGNOSIS — M899 Disorder of bone, unspecified: Secondary | ICD-10-CM | POA: Insufficient documentation

## 2022-08-26 DIAGNOSIS — Z8546 Personal history of malignant neoplasm of prostate: Secondary | ICD-10-CM | POA: Insufficient documentation

## 2022-08-26 NOTE — Progress Notes (Signed)
[facility] NOTE  Patient Care Team: Tracie Harrier, MD as PCP - General (Internal Medicine) Yolonda Kida, MD as Consulting Physician (Cardiology)  REFERRING PROVIDER: Dr. Ginette Pitman  REASON FOR REFFERAL: T12 lesion  CANCER STAGING   Cancer Staging  No matching staging information was found for the patient.  ASSESSMENT & PLAN:  Darin Fisher 76 y.o. male with pmh of hypertension, hyperlipidemia, prior prostate cancer s/p prostatectomy in 2006 by Dr. 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  #T12 lesion  #History of prostate cancer s/p prostatectomy in 2006   - Patient is on surveillance for right kidney solid lesion on a yearly basis.  MR abdomen done on 08/18/2022 showed stable solid lesion measuring 10 x 12 mm.  Also showed new enhancing T1 hypointense lesion in the posterolateral aspect of left T12 suspicious for metastatic lesion. -Last PSA in April 2023 was undetectable.  Repeat PSA level today. -Obtain PET CT scan to assess for the extent of the disease. -CT-guided biopsy of T12 lesion.   Orders Placed This Encounter  Procedures   NM PET Image Initial (PI) Skull Base To Thigh    Standing Status:   Future    Standing Expiration Date:   08/25/2023    Scheduling Instructions:     Earliest available    Order Specific Question:   If indicated for the ordered procedure, I authorize the administration of a radiopharmaceutical per Radiology protocol    Answer:   Yes    Order Specific Question:   Preferred imaging location?    Answer:   [facility]   CT Biopsy    Standing Status:   Future    Standing Expiration Date:   08/25/2023    Order Specific Question:   Lab orders requested (DO NOT place separate lab orders, these will be automatically ordered during procedure specimen collection):    Answer:   Surgical Pathology    Order Specific Question:   Reason for Exam (SYMPTOM  OR  DIAGNOSIS REQUIRED)    Answer:   Biopsy of T12 lesion seen on MRI    Order Specific Question:   Preferred location?    Answer:   [facility]   PSA    Standing Status:   Future    Number of Occurrences:   1    Standing Expiration Date:   08/26/2023    RTC in 3 weeks for MD visit to discuss results.  The total time spent in the appointment was 55 minutes encounter with patients including review of chart and various tests results, discussions about plan of care and coordination of care plan   All questions were answered. The patient knows to call the clinic with any problems, questions or concerns. No barriers to learning was detected.  [name], MD 11/21/20238:39 AM   HISTORY OF PRESENTING ILLNESS:  Darin Fisher 76 y.o. male with pmh of hypertension, hyperlipidemia, prior prostate cancer s/p prostatectomy in 2006 by Dr. Jacqlyn Larsen was referred to oncology for suspicious T12 lesion.  Patient is on surveillance for right kidney solid lesion on a yearly basis.  MR abdomen done on 08/18/2022 showed stable solid lesion measuring 10 x 12 mm.  Also showed new enhancing T1 hypointense lesion in the posterolateral aspect of left T12 suspicious for metastatic lesion.  Patient reports feeling well overall.  Denies any urinary symptoms.  He has intermittent back pain in the right lower  part of the back.  But denies any pain around the area of T12.  Denies any recent weight loss, fever, chills, pain.  Reports strong family history of prostate cancer.  Never had genetic testing.  I have reviewed his chart and materials related to his cancer extensively and collaborated history with the patient. Summary of oncologic history is as follows: Oncology History   No history exists.    MEDICAL HISTORY:  Past Medical History:  Diagnosis Date   Asthma    Cancer ([place])    HTN (hypertension)    Hypercholesterolemia    Prostate CA ([place])     SURGICAL HISTORY: Past Surgical History:  Procedure  Laterality Date   MELANOMA REMOVAL, LEFT WRIST     PROSTATE SURGERY      SOCIAL HISTORY: Social History   Socioeconomic History   Marital status: Married    Spouse name: Not on file   Number of children: Not on file   Years of education: Not on file   Highest education level: Not on file  Occupational History   Not on file  Tobacco Use   Smoking status: Never   Smokeless tobacco: Never  Substance and Sexual Activity   Alcohol use: No   Drug use: No   Sexual activity: Not Currently    Birth control/protection: None  Other Topics Concern   Not on file  Social History Narrative   Not on file   Social Determinants of Health   Financial Resource Strain: Not on file  Food Insecurity: Not on file  Transportation Needs: Not on file  Physical Activity: Not on file  Stress: Not on file  Social Connections: Not on file  Intimate Partner Violence: Not on file    FAMILY HISTORY: Family History  Problem Relation Age of Onset   Cancer Mother    Diabetes Mother    Breast cancer Mother    Cancer Father    Heart disease Father    Skin cancer Father    Diabetes Sister    Cancer Brother    Diabetes Brother     ALLERGIES:  has No Known Allergies.  MEDICATIONS:  Current Outpatient Medications  Medication Sig Dispense Refill   amiodarone (PACERONE) 200 MG tablet Take by mouth.     amLODipine (NORVASC) 5 MG tablet Take by mouth.     apixaban (ELIQUIS) 5 MG TABS tablet Take 2 tabs (10 mg ) two times a day till 07/27/17 and then from 07/28/17 start 1 tab (5 mg) twice a day 60 tablet 1   latanoprost (XALATAN) 0.005 % ophthalmic solution Place 1 drop into both eyes at bedtime.     lisinopril (ZESTRIL) 40 MG tablet Take 40 mg by mouth daily.     simvastatin (ZOCOR) 20 MG tablet Take 20 mg by mouth daily.     No current facility-administered medications for this visit.    REVIEW OF SYSTEMS:   Pertinent information mentioned in HPI All other systems were reviewed with the  patient and are negative.  PHYSICAL EXAMINATION: ECOG PERFORMANCE STATUS: 1 - Symptomatic but completely ambulatory  Vitals:   08/25/22 1429  BP: (!) 146/72  Pulse: 65  Resp: 20  Temp: 98.6 F (37 C)  SpO2: 98%   Filed Weights   08/25/22 1429  Weight: 235 lb (106.6 kg)    GENERAL:alert, no distress and comfortable SKIN: skin color, texture, turgor are normal, no rashes or significant lesions EYES: normal, conjunctiva are pink and non-injected, sclera clear OROPHARYNX:no exudate,  no erythema and lips, buccal mucosa, and tongue normal  NECK: supple, thyroid normal size, non-tender, without nodularity LYMPH:  no palpable lymphadenopathy in the cervical, axillary or inguinal LUNGS: clear to auscultation and percussion with normal breathing effort HEART: regular rate & rhythm and no murmurs and no lower extremity edema ABDOMEN:abdomen soft, non-tender and normal bowel sounds Musculoskeletal:no cyanosis of digits and no clubbing  PSYCH: alert & oriented x 3 with fluent speech NEURO: no focal motor/sensory deficits  LABORATORY DATA:  I have reviewed the data as listed Lab Results  Component Value Date   WBC 7.2 07/21/2017   HGB 14.5 07/21/2017   HCT 42.5 07/21/2017   MCV 87.6 07/21/2017   PLT 241 07/21/2017   No results for input(s): "NA", "K", "CL", "CO2", "GLUCOSE", "BUN", "CREATININE", "CALCIUM", "GFRNONAA", "GFRAA", "PROT", "ALBUMIN", "AST", "ALT", "ALKPHOS", "BILITOT", "BILIDIR", "IBILI" in the last 8760 hours.  RADIOGRAPHIC STUDIES: I have personally reviewed the radiological images as listed and agreed with the findings in the report. MR ABDOMEN WWO CONTRAST  Result Date: 08/19/2022 CLINICAL DATA:  Follow-up renal lesion in a 76 year old. EXAM: MRI ABDOMEN WITHOUT AND WITH CONTRAST TECHNIQUE: Multiplanar multisequence MR imaging of the abdomen was performed both before and after the administration of intravenous contrast. CONTRAST:  42m GADAVIST GADOBUTROL 1 MMOL/ML  IV SOLN COMPARISON:  MRI of the abdomen of August 15, 2021. FINDINGS: Lower chest: No signs of effusion. No sign of consolidative changes. Limited assessment of the lower chest on abdominal MRI. Hepatobiliary: No focal, suspicious hepatic lesion. No pericholecystic stranding. No biliary duct dilation. Portal vein is patent. Pancreas: Normal, without mass, inflammation or ductal dilatation. MRI Spleen:  Normal. Adrenals/Urinary Tract:  Adrenal glands are normal. RIGHT kidney: Arising from the lower pole of the RIGHT kidney is enhancing mildly heterogeneous solid renal lesion measuring approximately 10 by 12 mm. This is not changed since November of 2022. No additional suspicious renal lesion on the RIGHT. LEFT kidney: No suspicious renal lesion on the LEFT. No signs of hydronephrosis or perinephric stranding. Normal adrenal glands. Stomach/Bowel: No acute gastrointestinal findings to the extent evaluated. Vascular/Lymphatic: No pathologically enlarged lymph nodes identified. No abdominal aortic aneurysm demonstrated. Other:  None. Musculoskeletal: New enhancing T1 hypointense lesion in the posterolateral aspect of the LEFT T12 vertebral body. This was not seen on previous imaging and is separate from the disc space. This measures 11 mm. (Image 30/18) signal intensity on T1 weighted imaging is slightly less than or equal to the signal intensity of muscle. IMPRESSION: 1. New enhancing T1 hypointense lesion in the posterolateral aspect of the LEFT T12 vertebral body. This was not seen on previous imaging and is separate from the disc space. This measures 11 mm. This is suspicious for a small metastatic lesion. There is also a history of prostate neoplasm. PSA correlation may be helpful. This area is hypervascular and could represent a small metastatic lesion though this would be atypical given the size of the RIGHT renal neoplasm. 2. Stable appearance of enhancing solid renal lesion arising from the lower pole of the  RIGHT kidney. This is compatible with solid renal neoplasm, likely renal cell carcinoma. 3. No acute findings. These results will be called to the ordering clinician or representative by the Radiologist Assistant, and communication documented in the PACS or [employer] Electronically Signed   By: GZetta BillsM.D.   On: 08/19/2022 09:29

## 2022-09-04 ENCOUNTER — Ambulatory Visit: Payer: Medicare Other

## 2022-09-05 ENCOUNTER — Other Ambulatory Visit (HOSPITAL_COMMUNITY): Payer: Self-pay | Admitting: Student

## 2022-09-05 DIAGNOSIS — M899 Disorder of bone, unspecified: Secondary | ICD-10-CM

## 2022-09-05 NOTE — Progress Notes (Signed)
Patient for CT guided T12 bone lesion Biopsy on Mon 09/08/2022, I called and spoke with the patient on the phone and gave pre-procedure instructions. Pt was made aware to be here at 7:30a at the new entrance, last dose of Eliquis was Friday 09/05/22,  NPO after MN prior to procedure as well as driver post procedure/recovery/discharge. Pt stated understanding. Called 09/01/2022 and 09/05/2022

## 2022-09-07 NOTE — H&P (Signed)
Chief Complaint: Patient was seen in consultation today for T12 bone lesion at the request of [place]  Referring Physician(s): Agrawal,Kavita  Supervising Physician: Sandi Mariscal  Patient Status: [facility] - Out-pt  History of Present Illness: Darin Fisher is a 76 y.o. male with past medical history of prostate cancer status post prostatectomy 2006, HTN and DVT on Eliquis.  Patient underwent MR abdomen 08/18/2022 as part of his yearly surveillance for solid right kidney lesion.  Most recent imaging showed stable solid right kidney lesion and new enhancing T12 hypointense lesion in the posterior lateral aspect of the left T12 vertebral body suspicious for metastatic lesion.  Patient was referred to oncology to evaluate T12 lesion.  He was referred to IR for T12 lesion biopsy.  Dr. Anselm Pancoast, IR, approved procedure.  Pt denies fevers, chill, CP, SOB, N/V.  He is NPO per order. Last Eliquis was 09/05/22  Past Medical History:  Diagnosis Date   Asthma    Cancer ([place])    HTN (hypertension)    Hypercholesterolemia    Prostate CA [facility])     Past Surgical History:  Procedure Laterality Date   MELANOMA REMOVAL, LEFT WRIST     PROSTATE SURGERY      Allergies: Patient has no known allergies.  Medications: Prior to Admission medications   Medication Sig Start Date End Date Taking? Authorizing Provider  amiodarone (PACERONE) 200 MG tablet Take by mouth. 06/20/21 08/25/22  [provider]  amLODipine (NORVASC) 5 MG tablet Take by mouth. 11/07/21   [provider]  apixaban (ELIQUIS) 5 MG TABS tablet Take 2 tabs (10 mg ) two times a day till 07/27/17 and then from 07/28/17 start 1 tab (5 mg) twice a day 07/21/17   Fritzi Mandes, MD  latanoprost (XALATAN) 0.005 % ophthalmic solution Place 1 drop into both eyes at bedtime. 04/21/17   [provider]  lisinopril (ZESTRIL) 40 MG tablet Take 40 mg by mouth daily. 08/28/21   [provider]  simvastatin (ZOCOR)  20 MG tablet Take 20 mg by mouth daily. 06/01/17   [provider]     Family History  Problem Relation Age of Onset   Cancer Mother    Diabetes Mother    Breast cancer Mother    Cancer Father    Heart disease Father    Skin cancer Father    Diabetes Sister    Cancer Brother    Diabetes Brother     Social History   Socioeconomic History   Marital status: Married    Spouse name: Not on file   Number of children: Not on file   Years of education: Not on file   Highest education level: Not on file  Occupational History   Not on file  Tobacco Use   Smoking status: Never   Smokeless tobacco: Never  Substance and Sexual Activity   Alcohol use: No   Drug use: No   Sexual activity: Not Currently    Birth control/protection: None  Other Topics Concern   Not on file  Social History Narrative   Not on file   Social Determinants of Health   Financial Resource Strain: Not on file  Food Insecurity: Not on file  Transportation Needs: Not on file  Physical Activity: Not on file  Stress: Not on file  Social Connections: Not on file     Review of Systems: A 12 point ROS discussed and pertinent positives are indicated in the HPI above.  All other systems  are negative.  Review of Systems  All other systems reviewed and are negative.   Vital Signs: There were no vitals taken for this visit.    Physical Exam Vitals reviewed.  Constitutional:      General: He is not in acute distress.    Appearance: Normal appearance. He is not ill-appearing.  HENT:     Head: Normocephalic and atraumatic.     Mouth/Throat:     Mouth: Mucous membranes are dry.     Pharynx: Oropharynx is clear.  Eyes:     Extraocular Movements: Extraocular movements intact.     Pupils: Pupils are equal, round, and reactive to light.  Cardiovascular:     Rate and Rhythm: Regular rhythm. Bradycardia present.     Pulses: Normal pulses.     Heart sounds: Normal heart sounds. No murmur  heard. Pulmonary:     Effort: Pulmonary effort is normal. No respiratory distress.     Breath sounds: Normal breath sounds.  Abdominal:     General: Bowel sounds are normal. There is distension.     Tenderness: There is no abdominal tenderness. There is no guarding.  Musculoskeletal:     Right lower leg: Edema present.     Left lower leg: Edema present.  Skin:    General: Skin is warm and dry.  Neurological:     Mental Status: He is alert and oriented to person, place, and time.  Psychiatric:        Mood and Affect: Mood normal.        Behavior: Behavior normal.        Thought Content: Thought content normal.        Judgment: Judgment normal.     Imaging: MR ABDOMEN WWO CONTRAST  Result Date: 08/19/2022 CLINICAL DATA:  Follow-up renal lesion in a 76 year old. EXAM: MRI ABDOMEN WITHOUT AND WITH CONTRAST TECHNIQUE: Multiplanar multisequence MR imaging of the abdomen was performed both before and after the administration of intravenous contrast. CONTRAST:  43m GADAVIST GADOBUTROL 1 MMOL/ML IV SOLN COMPARISON:  MRI of the abdomen of August 15, 2021. FINDINGS: Lower chest: No signs of effusion. No sign of consolidative changes. Limited assessment of the lower chest on abdominal MRI. Hepatobiliary: No focal, suspicious hepatic lesion. No pericholecystic stranding. No biliary duct dilation. Portal vein is patent. Pancreas: Normal, without mass, inflammation or ductal dilatation. MRI Spleen:  Normal. Adrenals/Urinary Tract:  Adrenal glands are normal. RIGHT kidney: Arising from the lower pole of the RIGHT kidney is enhancing mildly heterogeneous solid renal lesion measuring approximately 10 by 12 mm. This is not changed since November of 2022. No additional suspicious renal lesion on the RIGHT. LEFT kidney: No suspicious renal lesion on the LEFT. No signs of hydronephrosis or perinephric stranding. Normal adrenal glands. Stomach/Bowel: No acute gastrointestinal findings to the extent evaluated.  Vascular/Lymphatic: No pathologically enlarged lymph nodes identified. No abdominal aortic aneurysm demonstrated. Other:  None. Musculoskeletal: New enhancing T1 hypointense lesion in the posterolateral aspect of the LEFT T12 vertebral body. This was not seen on previous imaging and is separate from the disc space. This measures 11 mm. (Image 30/18) signal intensity on T1 weighted imaging is slightly less than or equal to the signal intensity of muscle. IMPRESSION: 1. New enhancing T1 hypointense lesion in the posterolateral aspect of the LEFT T12 vertebral body. This was not seen on previous imaging and is separate from the disc space. This measures 11 mm. This is suspicious for a small metastatic lesion. There is also a  history of prostate neoplasm. PSA correlation may be helpful. This area is hypervascular and could represent a small metastatic lesion though this would be atypical given the size of the RIGHT renal neoplasm. 2. Stable appearance of enhancing solid renal lesion arising from the lower pole of the RIGHT kidney. This is compatible with solid renal neoplasm, likely renal cell carcinoma. 3. No acute findings. These results will be called to the ordering clinician or representative by the Radiologist Assistant, and communication documented in the PACS or [employer]. Electronically Signed   By: Zetta Bills M.D.   On: 08/19/2022 09:29    Labs:  CBC: No results for input(s): "WBC", "HGB", "HCT", "PLT" in the last 8760 hours.  COAGS: No results for input(s): "INR", "APTT" in the last 8760 hours.  BMP: No results for input(s): "NA", "K", "CL", "CO2", "GLUCOSE", "BUN", "CALCIUM", "CREATININE", "GFRNONAA", "GFRAA" in the last 8760 hours.  Invalid input(s): "CMP"  LIVER FUNCTION TESTS: No results for input(s): "BILITOT", "AST", "ALT", "ALKPHOS", "PROT", "ALBUMIN" in the last 8760 hours.  TUMOR MARKERS: No results for input(s): "AFPTM", "CEA", "CA199", "CHROMGRNA" in the last 8760  hours.  Assessment and Plan: 76 year old male with most recently discovered T12 lesion presents to IR for T12 lesion biopsy.  Pt resting on stretcher with wife at bedside,  Pt was seen in consultation with Dr. Pascal Lux.  He is A&O, calm and pleasant.  He is in no distress.  Risks and benefits of biopsy of T12 bone lesion with moderate sedation was discussed with the patient and/or patient's family including, but not limited to bleeding, infection, damage to adjacent structures or low yield requiring additional tests.  All of the questions were answered and there is agreement to proceed.  Consent signed and in chart.  Thank you for this interesting consult.  I greatly enjoyed meeting ROCH QUACH and look forward to participating in their care.  A copy of this report was sent to the requesting provider on this date.  Electronically Signed: Tyson Alias, NP 09/07/2022, 6:06 PM   I spent a total of 20 minutes in face to face in clinical consultation, greater than 50% of which was counseling/coordinating care for T12 lesion.

## 2022-09-08 ENCOUNTER — Ambulatory Visit
Admission: RE | Admit: 2022-09-08 | Discharge: 2022-09-08 | Disposition: A | Discharge status: Home / Self Care | Payer: Medicare Other | Source: Ambulatory Visit | Attending: Internal Medicine | Admitting: Internal Medicine

## 2022-09-08 ENCOUNTER — Other Ambulatory Visit: Payer: Self-pay

## 2022-09-08 DIAGNOSIS — Z85820 Personal history of malignant melanoma of skin: Secondary | ICD-10-CM | POA: Diagnosis not present

## 2022-09-08 DIAGNOSIS — Z8546 Personal history of malignant neoplasm of prostate: Secondary | ICD-10-CM | POA: Diagnosis not present

## 2022-09-08 DIAGNOSIS — I700 Atherosclerosis of aorta: Secondary | ICD-10-CM | POA: Diagnosis not present

## 2022-09-08 DIAGNOSIS — Z86718 Personal history of other venous thrombosis and embolism: Secondary | ICD-10-CM | POA: Diagnosis not present

## 2022-09-08 DIAGNOSIS — Z9079 Acquired absence of other genital organ(s): Secondary | ICD-10-CM | POA: Insufficient documentation

## 2022-09-08 DIAGNOSIS — Z7901 Long term (current) use of anticoagulants: Secondary | ICD-10-CM | POA: Insufficient documentation

## 2022-09-08 DIAGNOSIS — I10 Essential (primary) hypertension: Secondary | ICD-10-CM | POA: Insufficient documentation

## 2022-09-08 DIAGNOSIS — I2510 Atherosclerotic heart disease of native coronary artery without angina pectoris: Secondary | ICD-10-CM | POA: Diagnosis not present

## 2022-09-08 DIAGNOSIS — N289 Disorder of kidney and ureter, unspecified: Secondary | ICD-10-CM | POA: Insufficient documentation

## 2022-09-08 DIAGNOSIS — J439 Emphysema, unspecified: Secondary | ICD-10-CM | POA: Insufficient documentation

## 2022-09-08 DIAGNOSIS — I7121 Aneurysm of the ascending aorta, without rupture: Secondary | ICD-10-CM | POA: Insufficient documentation

## 2022-09-08 DIAGNOSIS — M899 Disorder of bone, unspecified: Secondary | ICD-10-CM | POA: Diagnosis not present

## 2022-09-08 DIAGNOSIS — C61 Malignant neoplasm of prostate: Secondary | ICD-10-CM | POA: Diagnosis present

## 2022-09-08 LAB — PROTIME-INR
INR: 1.2 (ref 0.8–1.2)
Prothrombin Time: 14.8 seconds (ref 11.4–15.2)

## 2022-09-08 LAB — CBC
HCT: 45.1 % (ref 39.0–52.0)
Hemoglobin: 15.2 g/dL (ref 13.0–17.0)
MCH: 29.3 pg (ref 26.0–34.0)
MCHC: 33.7 g/dL (ref 30.0–36.0)
MCV: 86.9 fL (ref 80.0–100.0)
Platelets: 253 10*3/uL (ref 150–400)
RBC: 5.19 MIL/uL (ref 4.22–5.81)
RDW: 12.9 % (ref 11.5–15.5)
WBC: 6.1 10*3/uL (ref 4.0–10.5)
nRBC: 0 % (ref 0.0–0.2)

## 2022-09-08 MED ORDER — LIDOCAINE HCL (PF) 1 % IJ SOLN
5.0000 mL | Freq: Once | INTRAMUSCULAR | Status: AC
  Administered 2022-09-08: 5 mL via INTRADERMAL

## 2022-09-08 MED ORDER — MIDAZOLAM HCL 2 MG/2ML IJ SOLN
INTRAMUSCULAR | Status: AC
  Filled 2022-09-08: qty 2

## 2022-09-08 MED ORDER — SODIUM CHLORIDE 0.9 % IV SOLN: INTRAVENOUS | Status: DC

## 2022-09-08 MED ORDER — FENTANYL CITRATE (PF) 100 MCG/2ML IJ SOLN
INTRAMUSCULAR | Status: AC
  Filled 2022-09-08: qty 2

## 2022-09-08 MED ORDER — FENTANYL CITRATE (PF) 100 MCG/2ML IJ SOLN
INTRAMUSCULAR | Status: AC | PRN
Start: 2022-09-08 — End: 2022-09-08
  Administered 2022-09-08: 50 ug via INTRAVENOUS
  Administered 2022-09-08: 25 ug via INTRAVENOUS

## 2022-09-08 MED ORDER — MIDAZOLAM HCL 2 MG/2ML IJ SOLN
INTRAMUSCULAR | Status: AC | PRN
  Administered 2022-09-08: .5 mg via INTRAVENOUS
  Administered 2022-09-08: 1 mg via INTRAVENOUS

## 2022-09-08 NOTE — Procedures (Signed)
Pre procedural Dx: T12 vertebral body lesion Post procedural Dx: Same  Technically successful CT guided biopsy of indeterminate lesion within the left side of the T12 vertebral body.    EBL: Trace.  Complications: None immediate.   Ronny Bacon, MD Pager #: [phone]

## 2022-09-08 NOTE — Progress Notes (Signed)
Patient clinically stable post procedure, vitals stable pre and post procedure. Received Versed 1.5 mg along with Fentanyl 75 mcg IV for procedure. Report given to [facility] RN specials 329. Discharge instructions given to patient/wife with questions answered.

## 2022-09-09 ENCOUNTER — Ambulatory Visit
Admission: RE | Admit: 2022-09-09 | Discharge: 2022-09-09 | Disposition: A | Discharge status: Home / Self Care | Payer: Medicare Other | Source: Ambulatory Visit | Attending: Internal Medicine | Admitting: Internal Medicine

## 2022-09-09 DIAGNOSIS — R937 Abnormal findings on diagnostic imaging of other parts of musculoskeletal system: Secondary | ICD-10-CM

## 2022-09-09 DIAGNOSIS — M899 Disorder of bone, unspecified: Secondary | ICD-10-CM

## 2022-09-09 LAB — GLUCOSE, CAPILLARY: Glucose-Capillary: 116 mg/dL — ABNORMAL HIGH (ref 70–99)

## 2022-09-09 MED ORDER — FLUDEOXYGLUCOSE F - 18 (FDG) INJECTION
12.2000 | Freq: Once | INTRAVENOUS | Status: AC | PRN
  Administered 2022-09-09: 12.61 via INTRAVENOUS

## 2022-09-10 LAB — SURGICAL PATHOLOGY

## 2022-09-16 ENCOUNTER — Inpatient Hospital Stay: Payer: Medicare Other | Attending: Internal Medicine | Admitting: Internal Medicine

## 2022-09-16 ENCOUNTER — Encounter: Payer: Self-pay | Admitting: Internal Medicine

## 2022-09-16 VITALS — BP 155/75 | HR 70 | Temp 98.6°F | Resp 20 | Wt 237.2 lb

## 2022-09-16 DIAGNOSIS — Z86711 Personal history of pulmonary embolism: Secondary | ICD-10-CM | POA: Diagnosis not present

## 2022-09-16 DIAGNOSIS — Z86718 Personal history of other venous thrombosis and embolism: Secondary | ICD-10-CM | POA: Insufficient documentation

## 2022-09-16 DIAGNOSIS — M899 Disorder of bone, unspecified: Secondary | ICD-10-CM | POA: Diagnosis not present

## 2022-09-16 DIAGNOSIS — Z9079 Acquired absence of other genital organ(s): Secondary | ICD-10-CM | POA: Insufficient documentation

## 2022-09-16 DIAGNOSIS — Z8546 Personal history of malignant neoplasm of prostate: Secondary | ICD-10-CM | POA: Insufficient documentation

## 2022-09-16 DIAGNOSIS — Z7901 Long term (current) use of anticoagulants: Secondary | ICD-10-CM | POA: Diagnosis not present

## 2022-09-16 DIAGNOSIS — Z79899 Other long term (current) drug therapy: Secondary | ICD-10-CM | POA: Insufficient documentation

## 2022-09-16 DIAGNOSIS — N289 Disorder of kidney and ureter, unspecified: Secondary | ICD-10-CM | POA: Diagnosis not present

## 2022-09-16 NOTE — Progress Notes (Signed)
Patient has no concerns 

## 2022-09-16 NOTE — Progress Notes (Signed)
[facility] NOTE  Patient Care Team: Darin Harrier, MD as PCP - General (Internal Medicine) Darin Kida, MD as Consulting Physician (Cardiology)  REFERRING PROVIDER: Dr. Ginette Fisher  REASON FOR REFFERAL: T12 lesion  CANCER STAGING   Cancer Staging  No matching staging information was found for the patient.  ASSESSMENT & PLAN:  Darin Fisher 76 y.o. male with pmh of hypertension, hyperlipidemia, prior prostate cancer s/p prostatectomy in 2006 by Dr. 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  #T12 lesion  #History of prostate cancer s/p prostatectomy in 2006  #Right kidney solid lesion, stable  - Patient is on surveillance for right kidney solid lesion on a yearly basis.  MR abdomen done on 08/18/2022 showed stable solid lesion measuring 10 x 12 mm.  Also showed new enhancing T1 hypointense lesion in the posterolateral aspect of left T12 suspicious for metastatic lesion.  -PET scan done in December 2023 showed stable ascending aortic aneurysm, benign appearing pulmonary nodule.  There was no hypermetabolic activity corresponding to the left-sided T12 lesion and no other evidence of metastasis was identified.  - s/p biopsy of T12 lesion on 09/08/2022 showed no evidence of malignancy.  - PSA level checked on 09/04/2022 was undetectable.  -Discussed with the patient about the PET scan, PSA and biopsy results which showed no evidence of malignancy.  Patient has been feeling well.  He will have repeat PSA checked with Dr. Ginette Fisher in April 2024.  I will repeat MRI T-spine in May 2024 to ensure stability.  He will continue to follow with his primary and urology for surveillance of the renal lesion.  I discussed with Darin Fisher and he will cancel the MRI that is scheduled on December 19 since he just had one a month ago.  He was informed.  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   Orders Placed This  Encounter  Procedures   MR Thoracic Spine W Wo Contrast    Standing Status:   Future    Standing Expiration Date:   09/16/2023    Order Specific Question:   GRA to provide read?    Answer:   Yes    Order Specific Question:   If indicated for the ordered procedure, I authorize the administration of contrast media per Radiology protocol    Answer:   Yes    Order Specific Question:   What is the patient's sedation requirement?    Answer:   No Sedation    Order Specific Question:   Use SRS Protocol?    Answer:   No    Order Specific Question:   Does the patient have a pacemaker or implanted devices?    Answer:   No    Order Specific Question:   Preferred imaging location?    Answer:   [facility] (table limit - 550lbs)    RTC in 5 months for MD visit, to discuss MRI.  The total time spent in the appointment was 30 minutes encounter with patients including review of chart and various tests results, discussions about plan of care and coordination of care plan   All questions were answered. The patient knows to call the clinic with any problems, questions or concerns. No barriers to learning was detected.  Darin Canary, MD 12/12/20233:44 PM   HISTORY OF PRESENTING ILLNESS:  Darin Fisher 76 y.o. male with pmh of hypertension, hyperlipidemia, prior prostate cancer s/p prostatectomy  in 2006 by Dr. Jacqlyn Fisher was referred to oncology for suspicious T12 lesion.  Patient is on surveillance for right kidney solid lesion on a yearly basis.  MR abdomen done on 08/18/2022 showed stable solid lesion measuring 10 x 12 mm.  Also showed new enhancing T1 hypointense lesion in the posterolateral aspect of left T12 suspicious for metastatic lesion.  Patient reports feeling well overall.  Denies any urinary symptoms.  He has intermittent back pain in the right lower part of the back.  But denies any pain around the area of T12.  Denies any recent weight loss, fever, chills, pain.  Reports strong  family history of prostate cancer.  Never had genetic testing.  Interval history Accompanied today with his son to discuss PET scan and biopsy results. Patient tolerated biopsy of T12 lesion well.  He continues to feel well.  Denies any concerns today.  I have reviewed his chart and materials related to his cancer extensively and collaborated history with the patient. Summary of oncologic history is as follows: Oncology History   No history exists.    MEDICAL HISTORY:  Past Medical History:  Diagnosis Date   Asthma    Cancer (Combine)    HTN (hypertension)    Hypercholesterolemia    Prostate CA ([place])     SURGICAL HISTORY: Past Surgical History:  Procedure Laterality Date   MELANOMA REMOVAL, LEFT WRIST     PROSTATE SURGERY      SOCIAL HISTORY: Social History   Socioeconomic History   Marital status: Married    Spouse name: Not on file   Number of children: Not on file   Years of education: Not on file   Highest education level: Not on file  Occupational History   Not on file  Tobacco Use   Smoking status: Never   Smokeless tobacco: Never  Substance and Sexual Activity   Alcohol use: No   Drug use: No   Sexual activity: Not Currently    Birth control/protection: None  Other Topics Concern   Not on file  Social History Narrative   Not on file   Social Determinants of Health   Financial Resource Strain: Not on file  Food Insecurity: Not on file  Transportation Needs: Not on file  Physical Activity: Not on file  Stress: Not on file  Social Connections: Not on file  Intimate Partner Violence: Not on file    FAMILY HISTORY: Family History  Problem Relation Age of Onset   Cancer Mother    Diabetes Mother    Breast cancer Mother    Cancer Father    Heart disease Father    Skin cancer Father    Diabetes Sister    Cancer Brother    Diabetes Brother     ALLERGIES:  has No Known Allergies.  MEDICATIONS:  Current Outpatient Medications  Medication Sig  Dispense Refill   amiodarone (PACERONE) 200 MG tablet Take by mouth.     amLODipine (NORVASC) 5 MG tablet Take by mouth.     apixaban (ELIQUIS) 5 MG TABS tablet Take 2 tabs (10 mg ) two times a day till 07/27/17 and then from 07/28/17 start 1 tab (5 mg) twice a day 60 tablet 1   latanoprost (XALATAN) 0.005 % ophthalmic solution Place 1 drop into both eyes at bedtime.     lisinopril (ZESTRIL) 40 MG tablet Take 40 mg by mouth daily.     simvastatin (ZOCOR) 20 MG tablet Take 20 mg by mouth daily.  No current facility-administered medications for this visit.    REVIEW OF SYSTEMS:   Pertinent information mentioned in HPI All other systems were reviewed with the patient and are negative.  PHYSICAL EXAMINATION: ECOG PERFORMANCE STATUS: 1 - Symptomatic but completely ambulatory  Vitals:   09/16/22 1452  BP: (!) 155/75  Pulse: 70  Resp: 20  Temp: 98.6 F (37 C)  SpO2: 100%   Filed Weights   09/16/22 1452  Weight: 237 lb 3.2 oz (107.6 kg)    GENERAL:alert, no distress and comfortable SKIN: skin color, texture, turgor are normal, no rashes or significant lesions EYES: normal, conjunctiva are pink and non-injected, sclera clear OROPHARYNX:no exudate, no erythema and lips, buccal mucosa, and tongue normal  NECK: supple, thyroid normal size, non-tender, without nodularity LYMPH:  no palpable lymphadenopathy in the cervical, axillary or inguinal LUNGS: clear to auscultation and percussion with normal breathing effort HEART: regular rate & rhythm and no murmurs and no lower extremity edema ABDOMEN:abdomen soft, non-tender and normal bowel sounds Musculoskeletal:no cyanosis of digits and no clubbing  PSYCH: alert & oriented x 3 with fluent speech NEURO: no focal motor/sensory deficits  LABORATORY DATA:  I have reviewed the data as listed Lab Results  Component Value Date   WBC 6.1 09/08/2022   HGB 15.2 09/08/2022   HCT 45.1 09/08/2022   MCV 86.9 09/08/2022   PLT 253 09/08/2022    No results for input(s): "NA", "K", "CL", "CO2", "GLUCOSE", "BUN", "CREATININE", "CALCIUM", "GFRNONAA", "GFRAA", "PROT", "ALBUMIN", "AST", "ALT", "ALKPHOS", "BILITOT", "BILIDIR", "IBILI" in the last 8760 hours.  RADIOGRAPHIC STUDIES: I have personally reviewed the radiological images as listed and agreed with the findings in the report. NM PET Image Initial (PI) Skull Base To Thigh  Result Date: 09/10/2022 CLINICAL DATA:  Initial treatment strategy for prostate cancer with prostatectomy in 2006. Remote history of melanoma. New T12 lesion. EXAM: NUCLEAR MEDICINE PET SKULL BASE TO THIGH TECHNIQUE: 12.6 mCi F-18 FDG was injected intravenously. Full-ring PET imaging was performed from the skull base to thigh after the radiotracer. CT data was obtained and used for attenuation correction and anatomic localization. Fasting blood glucose: 116 mg/dl COMPARISON:  08/18/2022 abdominal MRI.  CTA chest 03/20/2021. FINDINGS: Mediastinal blood pool activity: SUV max 1.5 Liver activity: SUV max NA NECK: No areas of abnormal hypermetabolism. Incidental CT findings: No cervical adenopathy. Bilateral carotid atherosclerosis. Mucosal thickening right maxillary sinus. CHEST: No pulmonary parenchymal or thoracic nodal hypermetabolism. Incidental CT findings: Ascending aortic aneurysm is similar at 4.1 cm on 95/2. Mild cardiomegaly. Aortic and coronary artery calcification. Azygous fissure. An anterior left upper lobe pulmonary nodule of 5 mm on 94/2 is similar on 03/20/2021 and considered benign. Mild centrilobular emphysema. Minimal left lower lobe nodularity including on 99/2 is similar on the prior and considered benign. ABDOMEN/PELVIS: No abdominopelvic parenchymal or nodal hypermetabolism. Incidental CT findings: Normal adrenal glands. Abdominal aortic and branch vessel atherosclerotic versus. Prostatectomy without local recurrence. SKELETON: No hypermetabolic correlate for the left-sided T12 enhancing lesion on prior  MRI. Example 142/2 No areas of osseous hypermetabolism identified. Incidental CT findings: Moderate right hip osteoarthritis. No worrisome osseous lesion. Degenerative changes about the left shoulder with small intra-articular loose body. IMPRESSION: 1. No hypermetabolism to correspond to the left-sided T12 lesion. 2. No tracer avid primary or soft tissue metastasis identified. 3. Similar ascending aortic aneurysm of 4.1 cm. Recommend annual imaging followup by CTA or MRA. This recommendation follows 2010 ACCF/AHA/AATS/ACR/ASA/SCA/SCAI/SIR/STS/SVM Guidelines for the Diagnosis and Management of Patients with Thoracic Aortic Disease.  Circulation. 2010; 121: J191-Y782. Aortic aneurysm NOS (ICD10-I71.9) 4. Incidental findings, including: Aortic atherosclerosis (ICD10-I70.0), coronary artery atherosclerosis and emphysema (ICD10-J43.9). Sinus disease. Prostatectomy. Electronically Signed   By: Abigail Miyamoto M.D.   On: 09/10/2022 11:33   CT BONE TROCAR/NEEDLE BIOPSY DEEP  Result Date: 09/08/2022 INDICATION: History of RCC and prostate cancer, now with indeterminate lesion involving the left side of the T12 vertebral body. Please perform CT-guided biopsy for tissue diagnostic purposes. EXAM: CT-GUIDED BIOPSY INDETERMINATE LESION INVOLVING THE T12 VERTEBRAL BODY MEDICATIONS: None ANESTHESIA/SEDATION: Moderate (conscious) sedation was employed during this procedure as administered by the Interventional Radiology RN. A total of Versed 1.5 mg and Fentanyl 75 mcg was administered intravenously. Moderate Sedation Time: 20 minutes. The patient's level of consciousness and vital signs were monitored continuously by radiology nursing throughout the procedure under my direct supervision. COMPLICATIONS: None immediate. PROCEDURE: Informed consent was obtained from the patient following an explanation of the procedure, risks, benefits and alternatives. The patient understands, agrees and consents for the procedure. All questions  were addressed. A time out was performed prior to the initiation of the procedure. The patient was positioned prone and non-contrast localization CT was performed of the thoracolumbar junction demonstrating an approximately 1.2 x 0.8 cm lytic lesion involving the posterior aspect of the left side of the T12 vertebral body, subjacent to the pedicle (image 18, series 3, correlating with the enhancing lesion seen on preceding abdominal MRI reference image 31, series 14. The operative site was prepped and draped in the usual sterile fashion. Under sterile conditions and local anesthesia, a 22 gauge spinal needle was utilized for procedural planning. Next, an 11 gauge coaxial bone biopsy needle was advanced adjacent to the lytic lesion (image 6, series 6). Appropriate position was confirmed with CT imaging and next the inner 13 gauge biopsy needle was utilized to acquire a tissue sample (image 6, series 7). This procedure was repeated 2 additional times with all acquired biopsy samples placed in formalin for pathologic analysis. Postprocedural imaging was obtained and the procedure was terminated. A dressing was applied. The patient tolerated the procedure well without immediate post procedural complication. IMPRESSION: Technically successful CT-guided core needle biopsy indeterminate lytic lesion involving the left side of the T12 vertebral body. If this biopsy proves nondiagnostic, further evaluation with prostate specific PET scan could be performed as indicated. Electronically Signed   By: Sandi Mariscal M.D.   On: 09/08/2022 11:14   MR ABDOMEN WWO CONTRAST  Result Date: 08/19/2022 CLINICAL DATA:  Follow-up renal lesion in a 76 year old. EXAM: MRI ABDOMEN WITHOUT AND WITH CONTRAST TECHNIQUE: Multiplanar multisequence MR imaging of the abdomen was performed both before and after the administration of intravenous contrast. CONTRAST:  40m GADAVIST GADOBUTROL 1 MMOL/ML IV SOLN COMPARISON:  MRI of the abdomen of  August 15, 2021. FINDINGS: Lower chest: No signs of effusion. No sign of consolidative changes. Limited assessment of the lower chest on abdominal MRI. Hepatobiliary: No focal, suspicious hepatic lesion. No pericholecystic stranding. No biliary duct dilation. Portal vein is patent. Pancreas: Normal, without mass, inflammation or ductal dilatation. MRI Spleen:  Normal. Adrenals/Urinary Tract:  Adrenal glands are normal. RIGHT kidney: Arising from the lower pole of the RIGHT kidney is enhancing mildly heterogeneous solid renal lesion measuring approximately 10 by 12 mm. This is not changed since November of 2022. No additional suspicious renal lesion on the RIGHT. LEFT kidney: No suspicious renal lesion on the LEFT. No signs of hydronephrosis or perinephric stranding. Normal adrenal glands. Stomach/Bowel: No acute  gastrointestinal findings to the extent evaluated. Vascular/Lymphatic: No pathologically enlarged lymph nodes identified. No abdominal aortic aneurysm demonstrated. Other:  None. Musculoskeletal: New enhancing T1 hypointense lesion in the posterolateral aspect of the LEFT T12 vertebral body. This was not seen on previous imaging and is separate from the disc space. This measures 11 mm. (Image 30/18) signal intensity on T1 weighted imaging is slightly less than or equal to the signal intensity of muscle. IMPRESSION: 1. New enhancing T1 hypointense lesion in the posterolateral aspect of the LEFT T12 vertebral body. This was not seen on previous imaging and is separate from the disc space. This measures 11 mm. This is suspicious for a small metastatic lesion. There is also a history of prostate neoplasm. PSA correlation may be helpful. This area is hypervascular and could represent a small metastatic lesion though this would be atypical given the size of the RIGHT renal neoplasm. 2. Stable appearance of enhancing solid renal lesion arising from the lower pole of the RIGHT kidney. 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 3. No acute findings. These results will be called to the ordering clinician or representative by the Radiologist Assistant, and communication documented in the PACS or [employer]. Electronically Signed   By: Zetta Bills M.D.   On: 08/19/2022 09:29

## 2022-09-23 ENCOUNTER — Ambulatory Visit: Payer: Commercial Managed Care - PPO

## 2022-09-25 ENCOUNTER — Ambulatory Visit (INDEPENDENT_AMBULATORY_CARE_PROVIDER_SITE_OTHER): Payer: Medicare Other | Admitting: Urology

## 2022-09-25 ENCOUNTER — Encounter: Payer: Self-pay | Admitting: Urology

## 2022-09-25 VITALS — BP 160/88 | HR 57 | Ht 70.0 in | Wt 237.2 lb

## 2022-09-25 DIAGNOSIS — N2889 Other specified disorders of kidney and ureter: Secondary | ICD-10-CM | POA: Diagnosis not present

## 2022-09-25 DIAGNOSIS — C61 Malignant neoplasm of prostate: Secondary | ICD-10-CM

## 2022-09-25 DIAGNOSIS — N189 Chronic kidney disease, unspecified: Secondary | ICD-10-CM | POA: Diagnosis not present

## 2022-09-25 DIAGNOSIS — Z8546 Personal history of malignant neoplasm of prostate: Secondary | ICD-10-CM

## 2022-09-25 NOTE — Progress Notes (Signed)
   09/25/2022 4:12 PM   Darin Fisher [birth year] [id number]  Reason for visit: Follow up history of prostate cancer, right renal mass on surveillance  HPI: 76 year old male who was treated with prostatectomy for prostate cancer by Dr. Jacqlyn Larsen in 2006 and PSA remains undetectable.  He also has a 1 cm exophytic subtly enhancing mass in the right kidney, and is currently on active surveillance, and this has been unchanged on CT from 2018.  He also has CKD with some worsening renal function with recent creatinine 1.8, EGFR 39.  I reviewed the outside GI notes extensively.  Interestingly he had an MRI of the abdomen in November 2023 that showed a stable 1 cm lesion on the right kidney which had not changed since 2018, however new enhancing T1 hypointense lesion in T12 vertebral body that was new and worrisome for malignancy.  He underwent a biopsy and PET scan of this which were both negative and showed no evidence of malignancy.  I personally viewed and interpreted those images.  Oncology is planning a repeat MRI in 6 months.  We discussed the low likelihood of this representing recurrent prostate cancer with his undetectable PSA, and unlikely to be renal cell carcinoma with the stable 1 cm renal lesion for the last 5 years.  Will continue active surveillance for stable, small, right renal lesion.  Agree with oncology assessment for repeat imaging in 6 months  RTC 1 year, will follow-up imaging ordered by oncology   [place], [place] [address] [place], [place] [address] [phone]

## 2022-10-01 ENCOUNTER — Ambulatory Visit: Payer: Commercial Managed Care - PPO | Admitting: Urology

## 2023-02-13 ENCOUNTER — Ambulatory Visit
Admission: RE | Admit: 2023-02-13 | Discharge: 2023-02-13 | Disposition: A | Discharge status: Home / Self Care | Payer: Medicare Other | Source: Ambulatory Visit | Attending: Internal Medicine

## 2023-02-13 DIAGNOSIS — M899 Disorder of bone, unspecified: Secondary | ICD-10-CM | POA: Insufficient documentation

## 2023-02-13 MED ORDER — GADOBUTROL 1 MMOL/ML IV SOLN
10.0000 mL | Freq: Once | INTRAVENOUS | Status: AC | PRN
  Administered 2023-02-13: 10 mL via INTRAVENOUS

## 2023-02-17 ENCOUNTER — Inpatient Hospital Stay: Payer: Medicare Other | Attending: Internal Medicine | Admitting: Internal Medicine

## 2023-02-17 ENCOUNTER — Encounter: Payer: Self-pay | Admitting: Internal Medicine

## 2023-02-17 VITALS — BP 132/76 | HR 61 | Temp 96.7°F | Wt 236.2 lb

## 2023-02-17 DIAGNOSIS — Z8546 Personal history of malignant neoplasm of prostate: Secondary | ICD-10-CM | POA: Diagnosis present

## 2023-02-17 DIAGNOSIS — Z803 Family history of malignant neoplasm of breast: Secondary | ICD-10-CM | POA: Diagnosis not present

## 2023-02-17 DIAGNOSIS — M899 Disorder of bone, unspecified: Secondary | ICD-10-CM | POA: Insufficient documentation

## 2023-02-17 DIAGNOSIS — Z808 Family history of malignant neoplasm of other organs or systems: Secondary | ICD-10-CM | POA: Insufficient documentation

## 2023-02-17 DIAGNOSIS — N289 Disorder of kidney and ureter, unspecified: Secondary | ICD-10-CM | POA: Diagnosis present

## 2023-02-17 DIAGNOSIS — Z9079 Acquired absence of other genital organ(s): Secondary | ICD-10-CM | POA: Insufficient documentation

## 2023-02-17 DIAGNOSIS — I7121 Aneurysm of the ascending aorta, without rupture: Secondary | ICD-10-CM | POA: Diagnosis not present

## 2023-02-17 NOTE — Progress Notes (Signed)
[facility] CONSULT NOTE  Patient Care Team: Darin Reichmann, MD as PCP - General (Internal Medicine) Darin Pea, MD as Consulting Physician (Cardiology)   ASSESSMENT & PLAN:  Darin Fisher 77 y.o. male with pmh of hypertension, hyperlipidemia, prior prostate cancer s/p prostatectomy in 2006 by Dr. 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  #T12 lesion  #History of prostate cancer s/p prostatectomy in 2006  #Right kidney solid lesion, stable - Patient is on surveillance for right kidney solid lesion on a yearly basis.  MR abdomen done on 08/18/2022 showed stable solid lesion measuring 10 x 12 mm.  Also showed new enhancing T1 hypointense lesion in the posterolateral aspect of left T12 suspicious for metastatic lesion.  -PET scan done in December 2023 showed stable ascending aortic aneurysm, benign appearing pulmonary nodule.  There was no hypermetabolic activity corresponding to the left-sided T12 lesion and no other evidence of metastasis was identified.  - s/p biopsy of T12 lesion on 09/08/2022 showed no evidence of malignancy.  - PSA level checked on 09/04/2022 was undetectable.  -MR thoracic spine (02/12/2023) showed partial collapse of the left T12 superior endplate in the area of previous core needle biopsy.  Abnormal enhancing signal remains indeterminate for metastasis.  T12 vertebrae was normal on 2021 MRI.  Recommended repeat thoracic spine MRI in 3 to 6 months to assess stability.  Imaging findings were discussed with the patient.  He is asymptomatic.  Will schedule for repeat MRI thoracic spine with and without contrast in 6 months.  Labs were reviewed and unremarkable.  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   Orders Placed This Encounter  Procedures   MR Thoracic Spine W Wo Contrast    Standing Status:   Future    Standing Expiration Date:   02/17/2024    Order Specific Question:   GRA to  provide read?    Answer:   Yes    Order Specific Question:   If indicated for the ordered procedure, I authorize the administration of contrast media per Radiology protocol    Answer:   Yes    Order Specific Question:   What is the patient's sedation requirement?    Answer:   No Sedation    Order Specific Question:   Use SRS Protocol?    Answer:   Yes    Order Specific Question:   Does the patient have a pacemaker or implanted devices?    Answer:   No    Order Specific Question:   Preferred imaging location?    Answer:   [facility] (table limit - 550lbs)   RTC in 6 months for MD visit to discuss MRI.  The total time spent in the appointment was 30 minutes encounter with patients including review of chart and various tests results, discussions about plan of care and coordination of care plan   All questions were answered. The patient knows to call the clinic with any problems, questions or concerns. No barriers to learning was detected.  Darin Barter, MD 5/14/20243:35 PM   HISTORY OF PRESENTING ILLNESS:  Darin Fisher 77 y.o. male with pmh of hypertension, hyperlipidemia, prior prostate cancer s/p prostatectomy in 2006 by Dr. Achilles Fisher was referred to oncology for suspicious T12 lesion.  Patient is on surveillance for right kidney solid lesion on a yearly basis.  MR abdomen done on 08/18/2022 showed stable solid lesion measuring 10 x 12  mm.  Also showed new enhancing T1 hypointense lesion in the posterolateral aspect of left T12 suspicious for metastatic lesion.  Patient reports feeling well overall.  Denies any urinary symptoms.  He has intermittent back pain in the right lower part of the back.  But denies any pain around the area of T12.  Denies any recent weight loss, fever, chills, pain.  Reports strong family history of prostate cancer.  Never had genetic testing.  Interval history Patient was seen today to discuss MRI spine results. He is feeling well overall.  Denies  any back pain.  Denies any new symptoms.  Has his baseline arthritic pain.  I have reviewed his chart and materials related to his cancer extensively and collaborated history with the patient. Summary of oncologic history is as follows: Oncology History   No history exists.    MEDICAL HISTORY:  Past Medical History:  Diagnosis Date   Asthma    Cancer (HCC)    HTN (hypertension)    Hypercholesterolemia    Prostate CA (HCC)     SURGICAL HISTORY: Past Surgical History:  Procedure Laterality Date   MELANOMA REMOVAL, LEFT WRIST     PROSTATE SURGERY      SOCIAL HISTORY: Social History   Socioeconomic History   Marital status: Married    Spouse name: Not on file   Number of children: Not on file   Years of education: Not on file   Highest education level: Not on file  Occupational History   Not on file  Tobacco Use   Smoking status: Never   Smokeless tobacco: Never  Substance and Sexual Activity   Alcohol use: No   Drug use: No   Sexual activity: Not Currently    Birth control/protection: None  Other Topics Concern   Not on file  Social History Narrative   Not on file   Social Determinants of Health   Financial Resource Strain: Not on file  Food Insecurity: Not on file  Transportation Needs: Not on file  Physical Activity: Not on file  Stress: Not on file  Social Connections: Not on file  Intimate Partner Violence: Not on file    FAMILY HISTORY: Family History  Problem Relation Age of Onset   Cancer Mother    Diabetes Mother    Breast cancer Mother    Cancer Father    Heart disease Father    Skin cancer Father    Diabetes Sister    Cancer Brother    Diabetes Brother     ALLERGIES:  has No Known Allergies.  MEDICATIONS:  Current Outpatient Medications  Medication Sig Dispense Refill   amiodarone (PACERONE) 200 MG tablet Take by mouth.     amLODipine (NORVASC) 10 MG tablet Take 1 tablet by mouth daily.     apixaban (ELIQUIS) 5 MG TABS tablet Take  2 tabs (10 mg ) two times a day till 07/27/17 and then from 07/28/17 start 1 tab (5 mg) twice a day 60 tablet 1   latanoprost (XALATAN) 0.005 % ophthalmic solution Place 1 drop into both eyes at bedtime.     lisinopril (ZESTRIL) 40 MG tablet Take 40 mg by mouth daily.     simvastatin (ZOCOR) 20 MG tablet Take 20 mg by mouth daily.     No current facility-administered medications for this visit.    REVIEW OF SYSTEMS:   Pertinent information mentioned in HPI All other systems were reviewed with the patient and are negative.  PHYSICAL EXAMINATION: ECOG PERFORMANCE  STATUS: 1 - Symptomatic but completely ambulatory  Vitals:   02/17/23 1501 02/17/23 1506  BP: (!) 153/71 132/76  Pulse: 61   Temp: (!) 96.7 F (35.9 C)   SpO2: 95%    Filed Weights   02/17/23 1501  Weight: 236 lb 3.2 oz (107.1 kg)    GENERAL:alert, no distress and comfortable SKIN: skin color, texture, turgor are normal, no rashes or significant lesions EYES: normal, conjunctiva are pink and non-injected, sclera clear OROPHARYNX:no exudate, no erythema and lips, buccal mucosa, and tongue normal  NECK: supple, thyroid normal size, non-tender, without nodularity LYMPH:  no palpable lymphadenopathy in the cervical, axillary or inguinal LUNGS: clear to auscultation and percussion with normal breathing effort HEART: regular rate & rhythm and no murmurs and no lower extremity edema ABDOMEN:abdomen soft, non-tender and normal bowel sounds Musculoskeletal:no cyanosis of digits and no clubbing  PSYCH: alert & oriented x 3 with fluent speech NEURO: no focal motor/sensory deficits  LABORATORY DATA:  I have reviewed the data as listed Lab Results  Component Value Date   WBC 6.1 09/08/2022   HGB 15.2 09/08/2022   HCT 45.1 09/08/2022   MCV 86.9 09/08/2022   PLT 253 09/08/2022   No results for input(s): "NA", "K", "CL", "CO2", "GLUCOSE", "BUN", "CREATININE", "CALCIUM", "GFRNONAA", "GFRAA", "PROT", "ALBUMIN", "AST", "ALT",  "ALKPHOS", "BILITOT", "BILIDIR", "IBILI" in the last 8760 hours.  RADIOGRAPHIC STUDIES: I have personally reviewed the radiological images as listed and agreed with the findings in the report. MR Thoracic Spine W Wo Contrast  Result Date: 02/17/2023 CLINICAL DATA:  77 year old male with history of prostate cancer and melanoma. T12 vertebral lesion 1st identified on abdomen MRI in 2023. Status post CT-guided T12 core needle biopsy in December, no malignancy detected at pathology then. EXAM: MRI THORACIC WITHOUT AND WITH CONTRAST TECHNIQUE: Multiplanar and multiecho pulse sequences of the thoracic spine were obtained without and with intravenous contrast. CONTRAST:  10mL GADAVIST GADOBUTROL 1 MMOL/ML IV SOLN COMPARISON:  PET-CT 09/09/2022. Abdomen MRI 08/18/2022. Lumbar MRI 04/23/2020 FINDINGS: Limited cervical spine imaging:  Negative. Thoracic spine segmentation: Normal, concordant with the 2021 lumbar numbering. Alignment: Normal thoracic kyphosis. No significant scoliosis or spondylolisthesis. Vertebrae: Thoracic vertebral marrow signal is within normal limits from T1 through T10. Incidental benign vertebral body hemangiomas including T9 and T11. Faint degenerative appearing anterior inferior T11 endplate marrow edema, with evidence of adjacent vacuum disc there which is new from 2021. Partial collapse of the T12 vertebral superior endplate since 2021. Superior endplate deformity to the left of midline which is partially enhancing, and contiguous with additional enhancing and STIR hyperintense left posterior body and anterior pedicle T1 hypointense lesion. This includes the previously biopsied area and these are best demonstrated series 26 images 12 and 13. T12 marrow signal was homogeneous, normal throughout in 2021. and the superior endplate collapse is new since the immediate post biopsy PET-CT. No marrow edema in the surrounding T12 vertebra. 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 No associated cord edema or expansion. Capacious thoracic spinal canal at most levels. No abnormal intradural enhancement or dural thickening. Paraspinal and other soft tissues: Negative. Incidental right upper lung azygous lobe and fissure (normal variant). Disc levels: Generally mild for age thoracic spine degeneration. Small central to right paracentral disc herniation at T7-T8 (series 22, image 23). No significant thoracic spinal stenosis. IMPRESSION: 1. Partial collapse of the left T12 superior endplate in the area of previous  core needle biopsy. Abnormal enhancing signal there and somewhat separately tracking toward the left T12 pedicle. This remains indeterminate for metastasis, the T12 vertebra was normal on a 2021 MRI. This exam will serve as a new post biopsy baseline MRI appearance of that vertebra. Recommend repeat Thoracic Spine MRI without and with contrast in 3-6 months to re-evaluate. 2. No other metastatic disease identified in the thoracic spine. Mild for age thoracic spine degeneration. Electronically Signed   By: Odessa Fleming M.D.   On: 02/17/2023 12:14

## 2023-05-20 ENCOUNTER — Other Ambulatory Visit: Payer: Self-pay

## 2023-05-20 ENCOUNTER — Emergency Department: Payer: Medicare Other

## 2023-05-20 ENCOUNTER — Emergency Department
Admission: EM | Admit: 2023-05-20 | Discharge: 2023-05-21 | Disposition: A | Discharge status: Home / Self Care | Payer: Medicare Other | Attending: Emergency Medicine | Admitting: Emergency Medicine

## 2023-05-20 DIAGNOSIS — Z20822 Contact with and (suspected) exposure to covid-19: Secondary | ICD-10-CM | POA: Diagnosis not present

## 2023-05-20 DIAGNOSIS — R110 Nausea: Secondary | ICD-10-CM | POA: Diagnosis not present

## 2023-05-20 DIAGNOSIS — J189 Pneumonia, unspecified organism: Secondary | ICD-10-CM | POA: Insufficient documentation

## 2023-05-20 DIAGNOSIS — N189 Chronic kidney disease, unspecified: Secondary | ICD-10-CM | POA: Insufficient documentation

## 2023-05-20 DIAGNOSIS — R6889 Other general symptoms and signs: Secondary | ICD-10-CM | POA: Diagnosis present

## 2023-05-20 LAB — CBC WITH DIFFERENTIAL/PLATELET
Abs Immature Granulocytes: 0.04 10*3/uL (ref 0.00–0.07)
Basophils Absolute: 0 10*3/uL (ref 0.0–0.1)
Basophils Relative: 1 %
Eosinophils Absolute: 0.1 10*3/uL (ref 0.0–0.5)
Eosinophils Relative: 1 %
HCT: 47.2 % (ref 39.0–52.0)
Hemoglobin: 15.9 g/dL (ref 13.0–17.0)
Immature Granulocytes: 1 %
Lymphocytes Relative: 10 %
Lymphs Abs: 0.6 10*3/uL — ABNORMAL LOW (ref 0.7–4.0)
MCH: 29.2 pg (ref 26.0–34.0)
MCHC: 33.7 g/dL (ref 30.0–36.0)
MCV: 86.6 fL (ref 80.0–100.0)
Monocytes Absolute: 0 10*3/uL — ABNORMAL LOW (ref 0.1–1.0)
Monocytes Relative: 1 %
Neutro Abs: 5.4 10*3/uL (ref 1.7–7.7)
Neutrophils Relative %: 86 %
Platelets: 217 10*3/uL (ref 150–400)
RBC: 5.45 MIL/uL (ref 4.22–5.81)
RDW: 13.5 % (ref 11.5–15.5)
WBC: 6.2 10*3/uL (ref 4.0–10.5)
nRBC: 0 % (ref 0.0–0.2)

## 2023-05-20 LAB — LACTIC ACID, PLASMA: Lactic Acid, Venous: 1.9 mmol/L (ref 0.5–1.9)

## 2023-05-20 LAB — URINALYSIS, ROUTINE W REFLEX MICROSCOPIC
Bacteria, UA: NONE SEEN
Bilirubin Urine: NEGATIVE
Glucose, UA: NEGATIVE mg/dL
Ketones, ur: NEGATIVE mg/dL
Leukocytes,Ua: NEGATIVE
Nitrite: NEGATIVE
Protein, ur: >=300 mg/dL — AB
Specific Gravity, Urine: 1.011 (ref 1.005–1.030)
pH: 5 (ref 5.0–8.0)

## 2023-05-20 LAB — COMPREHENSIVE METABOLIC PANEL
ALT: 38 U/L (ref 0–44)
AST: 30 U/L (ref 15–41)
Albumin: 4.1 g/dL (ref 3.5–5.0)
Alkaline Phosphatase: 73 U/L (ref 38–126)
Anion gap: 10 (ref 5–15)
BUN: 24 mg/dL — ABNORMAL HIGH (ref 8–23)
CO2: 22 mmol/L (ref 22–32)
Calcium: 8.9 mg/dL (ref 8.9–10.3)
Chloride: 104 mmol/L (ref 98–111)
Creatinine, Ser: 1.94 mg/dL — ABNORMAL HIGH (ref 0.61–1.24)
GFR, Estimated: 36 mL/min — ABNORMAL LOW (ref >60)
Glucose, Bld: 124 mg/dL — ABNORMAL HIGH (ref 70–99)
Potassium: 4.6 mmol/L (ref 3.5–5.1)
Sodium: 136 mmol/L (ref 135–145)
Total Bilirubin: 0.8 mg/dL (ref 0.3–1.2)
Total Protein: 7.5 g/dL (ref 6.5–8.1)

## 2023-05-20 LAB — RESP PANEL BY RT-PCR (RSV, FLU A&B, COVID)  RVPGX2
Influenza A by PCR: NEGATIVE
Influenza B by PCR: NEGATIVE
Resp Syncytial Virus by PCR: NEGATIVE
SARS Coronavirus 2 by RT PCR: NEGATIVE

## 2023-05-20 LAB — PROCALCITONIN: Procalcitonin: 0.12 ng/mL

## 2023-05-20 MED ORDER — ACETAMINOPHEN 500 MG PO TABS
1000.0000 mg | ORAL_TABLET | Freq: Once | ORAL | Status: AC
  Administered 2023-05-20: 1000 mg via ORAL
  Filled 2023-05-20: qty 2

## 2023-05-20 MED ORDER — SODIUM CHLORIDE 0.9 % IV SOLN
500.0000 mg | Freq: Once | INTRAVENOUS | Status: AC
  Administered 2023-05-20: 500 mg via INTRAVENOUS
  Filled 2023-05-20: qty 5

## 2023-05-20 MED ORDER — SODIUM CHLORIDE 0.9 % IV SOLN
2.0000 g | Freq: Once | INTRAVENOUS | Status: AC
  Administered 2023-05-20: 2 g via INTRAVENOUS
  Filled 2023-05-20: qty 20

## 2023-05-20 MED ORDER — SODIUM CHLORIDE 0.9 % IV BOLUS
1000.0000 mL | Freq: Once | INTRAVENOUS | Status: AC
  Administered 2023-05-20: 1000 mL via INTRAVENOUS

## 2023-05-20 NOTE — ED Notes (Signed)
Patient transported to CT 

## 2023-05-20 NOTE — ED Notes (Signed)
Assumed care of pt at this time. Pt is AAXO4, VS stable and WNL. Pt states new onset of uncontrollable shaking that began tonight around 1900 while watching church on tv. Pt states someone at church tests + for covid a few weeks ago. No meds taken PTA. Pt accompanied by son.

## 2023-05-20 NOTE — ED Provider Notes (Signed)
[facility] Provider Note    Event Date/Time   First MD Initiated Contact with Patient 05/20/23 2118     (approximate)   History   Shaking   HPI  Darin Fisher is a 77 y.o. male  here with rigors. Pt reports that he was in his usual state of health until about 2 hours ago. Reports he was resting at home when he developed acute, severe rigors/shaking. He tried to control it which "just made things worse." He felt mildly nauseous, fatigued with this. No other complaints. No loss of consciousness. No h/o similar episodes. No recent medication changes. No urinary sx. No back pain.       Physical Exam   Triage Vital Signs: ED Triage Vitals  Encounter Vitals Group     BP 05/20/23 2106 (!) 144/82     Systolic BP Percentile --      Diastolic BP Percentile --      Pulse Rate 05/20/23 2106 95     Resp 05/20/23 2106 (!) 24     Temp 05/20/23 2106 100.2 F (37.9 C)     Temp Source 05/20/23 2106 Oral     SpO2 05/20/23 2105 96 %     Weight 05/20/23 2106 230 lb (104.3 kg)     Height 05/20/23 2106 5\' 10"  (1.778 m)     Head Circumference --      Peak Flow --      Pain Score --      Pain Loc --      Pain Education --      Exclude from Growth Chart --     Most recent vital signs: Vitals:   05/21/23 0000 05/21/23 0046  BP: (!) 149/73 129/76  Pulse: 90 78  Resp: 20 16  Temp:    SpO2: 99% 98%     General: Awake, mild distress 2/2 significant rigors. CV:  Good peripheral perfusion. Regular rate and rhythm. Resp:  Normal work of breathing. Lungs clear bilaterally though occasional cough noted. Abd:  No distention. No tenderness. Hyperactive bowel sounds. Other:  AOx3. No focal neuro deficits. R ear with cholesteatoma of EAC with mild erythema, opacification of TM. No mastoid tenderness or asymmetry.   ED Results / Procedures / Treatments   Labs (all labs ordered are listed, but only abnormal results are displayed) Labs Reviewed  CBC WITH  DIFFERENTIAL/PLATELET - Abnormal; Notable for the following components:      Result Value   Lymphs Abs 0.6 (*)    Monocytes Absolute 0.0 (*)    All other components within normal limits  COMPREHENSIVE METABOLIC PANEL - Abnormal; Notable for the following components:   Glucose, Bld 124 (*)    BUN 24 (*)    Creatinine, Ser 1.94 (*)    GFR, Estimated 36 (*)    All other components within normal limits  URINALYSIS, ROUTINE W REFLEX MICROSCOPIC - Abnormal; Notable for the following components:   Color, Urine YELLOW (*)    APPearance CLEAR (*)    Hgb urine dipstick SMALL (*)    Protein, ur >=300 (*)    All other components within normal limits  RESP PANEL BY RT-PCR (RSV, FLU A&B, COVID)  RVPGX2  LACTIC ACID, PLASMA  PROCALCITONIN     EKG    RADIOLOGY CXR: Query basilar PNA   I also independently reviewed and agree with radiologist interpretations.   PROCEDURES:  Critical Care performed: Yes, see critical care procedure note(s)  Procedures  MEDICATIONS ORDERED IN ED: Medications  acetaminophen (TYLENOL) tablet 1,000 mg (1,000 mg Oral Given 05/20/23 2153)  sodium chloride 0.9 % bolus 1,000 mL (0 mLs Intravenous Stopped 05/20/23 2325)  cefTRIAXone (ROCEPHIN) 2 g in sodium chloride 0.9 % 100 mL IVPB (0 g Intravenous Stopped 05/20/23 2326)  azithromycin (ZITHROMAX) 500 mg in sodium chloride 0.9 % 250 mL IVPB (0 mg Intravenous Stopped 05/21/23 0030)  sodium chloride 0.9 % bolus 1,000 mL (0 mLs Intravenous Stopped 05/21/23 0015)     IMPRESSION / MDM / ASSESSMENT AND PLAN / ED COURSE  I reviewed the triage vital signs and the nursing notes.                              Differential diagnosis includes, but is not limited to, rigors 2/2 occult sepsis, ? UTI, PNA, COVID-19, medication effect, thyroid d/o  Patient's presentation is most consistent with acute presentation with potential threat to life or bodily function.  The patient is on the cardiac monitor to evaluate for  evidence of arrhythmia and/or significant heart rate changes  77 yo M here with rigors, fatigue. Pt temp 100.2 here, tachycardia, with history c/w likely rigors from fever. No apparent source clinically, though he does have some mild cough on exam. UA negative. Labs overall reassuring thus far - procal negaive, CBC without leukocytosis. CMP shows mild acute on chronic kidney disease. IVF given. CXR shows possible PNA - will cover with Rocephin/Azithro, fluids, and reassess. Given his unclear etiology of significant rigors, will obtain additional imaging including CT Temporal as he has had some diminished hearing, R ear pain x 1 month. Clinical Course as of 05/21/23 0107  Wed May 20, 2023  2345 Reassessed and introduced myself.  Discussed CT results with signs of pneumonia. [DS]  Thu May 21, 2023  0045 Reassessed and discussed plan of care.  Patient would prefer to go home.  Reports that he would prefer to stay until morning, though.  Does not think he needs to be admitted and would prefer not to [DS]    Clinical Course User Index [DS] Delton Prairie, MD     FINAL CLINICAL IMPRESSION(S) / ED DIAGNOSES   Final diagnoses:  Atypical pneumonia     Rx / DC Orders   ED Discharge Orders          Ordered    doxycycline (VIBRA-TABS) 100 MG tablet  2 times daily        05/21/23 0050             Note:  This document was prepared using Dragon voice recognition software and may include unintentional dictation errors.   Shaune Pollack, MD 05/21/23 [phone]

## 2023-05-20 NOTE — ED Notes (Signed)
ED Provider at bedside. 

## 2023-05-20 NOTE — ED Triage Notes (Signed)
Pt BIB EMS for chills and uncontrollable shaking. EMS report HTN. Patient ambulatory and transitioned self at triage.

## 2023-05-21 DIAGNOSIS — J189 Pneumonia, unspecified organism: Secondary | ICD-10-CM | POA: Diagnosis not present

## 2023-05-21 MED ORDER — DOXYCYCLINE HYCLATE 100 MG PO TABS: 100.0000 mg | ORAL_TABLET | Freq: Two times a day (BID) | ORAL | 0 refills | Status: AC

## 2023-05-21 NOTE — Discharge Instructions (Addendum)
Use Tylenol for pain and fevers.  Up to 1000 mg per dose, up to 4 times per day.  Do not take more than 4000 mg of Tylenol/acetaminophen within 24 hours..  You have signs of a pneumonia on CT scan of your chest.  You are being discharged with a prescription for doxycycline antibiotics to take twice daily for 7 more days

## 2023-05-21 NOTE — ED Notes (Signed)
Provided pt with discharge instructions and education. All of pt questions answered. Pt in possession of all belongings. Pt AAOX4 and stable at time of discharge. Pt family unable to pick up pt for tx home, pt offered by provider to await morning pick up in the subwaiting area, pt agreeable to plan. Pt transported to subwaiting and laying in recliner for AM family pickup.

## 2023-05-21 NOTE — ED Provider Notes (Signed)
Patient received in signout from Dr. Erma Heritage pending CT imaging in the setting of tremors and rigors.  CT with evidence of an atypical pneumonia.  He is barely meeting SIRS/sepsis criteria with presenting tachypnea and borderline heart rates.  He is provided IV fluids and started on antibiotics and reports feeling well without recurrence of rigors.  Serum workup with CKD, normal CBC, normal lactic acid.  He reports feeling systemically well and comfortable going home, but cannot get family to pick him up until morning.  We will keep him in our subwaiting area until morning.  Discharged with doxycycline.  We discussed close return precautions.  Clinical Course as of 05/21/23 0113  Wed May 20, 2023  2345 Reassessed and introduced myself.  Discussed CT results with signs of pneumonia. [DS]  Thu May 21, 2023  0045 Reassessed and discussed plan of care.  Patient would prefer to go home.  Reports that he would prefer to stay until morning, though.  Does not think he needs to be admitted and would prefer not to [DS]    Clinical Course User Index [DS] Delton Prairie, MD   .Critical Care  Performed by: Delton Prairie, MD Authorized by: Delton Prairie, MD   Critical care provider statement:    Critical care time (minutes):  30   Critical care time was exclusive of:  Separately billable procedures and treating other patients   Critical care was necessary to treat or prevent imminent or life-threatening deterioration of the following conditions:  Sepsis   Critical care was time spent personally by me on the following activities:  Development of treatment plan with patient or surrogate, discussions with consultants, evaluation of patient's response to treatment, examination of patient, ordering and review of laboratory studies, ordering and review of radiographic studies, ordering and performing treatments and interventions, pulse oximetry, re-evaluation of patient's condition and review of old charts        Delton Prairie, MD 05/21/23 [phone]

## 2023-05-21 NOTE — ED Notes (Signed)
Pt tremors have resolved.

## 2023-08-17 ENCOUNTER — Ambulatory Visit
Admission: RE | Admit: 2023-08-17 | Discharge: 2023-08-17 | Disposition: A | Discharge status: Home / Self Care | Payer: Medicare Other | Source: Ambulatory Visit | Attending: Internal Medicine | Admitting: Internal Medicine

## 2023-08-17 DIAGNOSIS — M899 Disorder of bone, unspecified: Secondary | ICD-10-CM | POA: Diagnosis present

## 2023-08-17 MED ORDER — GADOBUTROL 1 MMOL/ML IV SOLN
10.0000 mL | Freq: Once | INTRAVENOUS | Status: AC | PRN
  Administered 2023-08-17: 10 mL via INTRAVENOUS

## 2023-08-24 ENCOUNTER — Inpatient Hospital Stay: Payer: Medicare Other

## 2023-08-24 ENCOUNTER — Inpatient Hospital Stay: Payer: Medicare Other | Attending: Internal Medicine | Admitting: Internal Medicine

## 2023-08-24 VITALS — BP 137/72 | HR 60 | Temp 97.3°F | Wt 241.0 lb

## 2023-08-24 DIAGNOSIS — N289 Disorder of kidney and ureter, unspecified: Secondary | ICD-10-CM | POA: Insufficient documentation

## 2023-08-24 DIAGNOSIS — M899 Disorder of bone, unspecified: Secondary | ICD-10-CM | POA: Diagnosis present

## 2023-08-24 DIAGNOSIS — Z9079 Acquired absence of other genital organ(s): Secondary | ICD-10-CM | POA: Diagnosis not present

## 2023-08-24 DIAGNOSIS — Z86718 Personal history of other venous thrombosis and embolism: Secondary | ICD-10-CM | POA: Insufficient documentation

## 2023-08-24 DIAGNOSIS — Z8546 Personal history of malignant neoplasm of prostate: Secondary | ICD-10-CM | POA: Insufficient documentation

## 2023-08-24 DIAGNOSIS — Z803 Family history of malignant neoplasm of breast: Secondary | ICD-10-CM | POA: Diagnosis not present

## 2023-08-24 DIAGNOSIS — Z808 Family history of malignant neoplasm of other organs or systems: Secondary | ICD-10-CM | POA: Insufficient documentation

## 2023-08-24 DIAGNOSIS — Z809 Family history of malignant neoplasm, unspecified: Secondary | ICD-10-CM | POA: Insufficient documentation

## 2023-08-24 DIAGNOSIS — Z7901 Long term (current) use of anticoagulants: Secondary | ICD-10-CM | POA: Insufficient documentation

## 2023-08-24 NOTE — Progress Notes (Signed)
[facility] CONSULT NOTE  Patient Care Team: Barbette Reichmann, MD as PCP - General (Internal Medicine) Alwyn Pea, MD as Consulting Physician (Cardiology)   ASSESSMENT & PLAN:  Darin Fisher 77 y.o. male with pmh of hypertension, hyperlipidemia, prior prostate cancer s/p prostatectomy in 2006 by Dr. 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  #T12 lesion  #History of prostate cancer s/p prostatectomy in 2006  #Right kidney solid lesion, stable - Patient is on surveillance for right kidney solid lesion on a yearly basis.  MR abdomen done on 08/18/2022 showed stable solid lesion measuring 10 x 12 mm.  Also showed new enhancing T1 hypointense lesion in the posterolateral aspect of left T12 suspicious for metastatic lesion.  -PET scan done in December 2023 showed stable ascending aortic aneurysm, benign appearing pulmonary nodule.  There was no hypermetabolic activity corresponding to the left-sided T12 lesion and no other evidence of metastasis was identified.  - s/p biopsy of T12 lesion on 09/08/2022 showed no evidence of malignancy.  - PSA level checked on 09/04/2022 was undetectable.  -MR thoracic spine (02/12/2023) showed partial collapse of the left T12 superior endplate in the area of previous core needle biopsy.  Abnormal enhancing signal remains indeterminate for metastasis.  T12 vertebrae was normal on 2021 MRI.    - MR thoracic spine (from 08/17/2023) showed 2 mm increase in the size of T12 lesion now measuring 19 mm.  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  I will add the case to this week's tumor conference to discuss the imaging and if repeat biopsy would be feasible.  He did have CT chest abdomen pelvis in August 2024 when he was admitted with pneumonia.  It showed multifocal opacities in bilateral lungs concerning for atypical pneumonia.  Otherwise no other concerns for metastatic  disease.  He has history of right kidney lesion but it has been stable for the past 5 years.  I will also obtain myeloma panel today.  He has history of CKD since 2018.  PSA from April 2024 was 0.01.  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   Orders Placed This Encounter  Procedures   Multiple Myeloma Panel (SPEP&IFE w/QIG)    Standing Status:   Future    Number of Occurrences:   1    Standing Expiration Date:   08/23/2024   Kappa/lambda light chains    Standing Status:   Future    Number of Occurrences:   1    Standing Expiration Date:   08/23/2024   Immunofixation, urine    Standing Status:   Future    Number of Occurrences:   1    Standing Expiration Date:   08/23/2024   Will schedule further follow-ups depending on the discussion from tumor conference.  The total time spent in the appointment was 30 minutes encounter with patients including review of chart and various tests results, discussions about plan of care and coordination of care plan   All questions were answered. The patient knows to call the clinic with any problems, questions or concerns. No barriers to learning was detected.  Michaelyn Barter, MD 11/18/20243:20 PM   HISTORY OF PRESENTING ILLNESS:  Darin Fisher 77 y.o. male with pmh of hypertension, hyperlipidemia, prior prostate cancer s/p prostatectomy in 2006 by Dr. Achilles Dunk was referred to oncology for suspicious T12 lesion.  Patient is on surveillance for right kidney solid  lesion on a yearly basis.  MR abdomen done on 08/18/2022 showed stable solid lesion measuring 10 x 12 mm.  Also showed new enhancing T1 hypointense lesion in the posterolateral aspect of left T12 suspicious for metastatic lesion.  Patient reports feeling well overall.  Denies any urinary symptoms.  He has intermittent back pain in the right lower part of the back.  But denies any pain around the area of T12.  Denies any recent weight loss, fever, chills, pain.  Reports strong family history of  prostate cancer.  Never had genetic testing.  Interval history Patient was seen today to discuss MRI spine results. He was admitted in August for pneumonia.  Otherwise has been feeling well.  Denies any symptoms in his back.  I have reviewed his chart and materials related to his cancer extensively and collaborated history with the patient. Summary of oncologic history is as follows: Oncology History   No history exists.    MEDICAL HISTORY:  Past Medical History:  Diagnosis Date   Asthma    Cancer (HCC)    HTN (hypertension)    Hypercholesterolemia    Prostate CA (HCC)     SURGICAL HISTORY: Past Surgical History:  Procedure Laterality Date   MELANOMA REMOVAL, LEFT WRIST     PROSTATE SURGERY      SOCIAL HISTORY: Social History   Socioeconomic History   Marital status: Married    Spouse name: Not on file   Number of children: Not on file   Years of education: Not on file   Highest education level: Not on file  Occupational History   Not on file  Tobacco Use   Smoking status: Never   Smokeless tobacco: Never  Substance and Sexual Activity   Alcohol use: No   Drug use: No   Sexual activity: Not Currently    Birth control/protection: None  Other Topics Concern   Not on file  Social History Narrative   Not on file   Social Determinants of Health   Financial Resource Strain: Low Risk  (08/24/2023)   Received from [facility]   Overall Financial Resource Strain (CARDIA)    Difficulty of Paying Living Expenses: Not hard at all  Food Insecurity: No Food Insecurity (08/24/2023)   Received from [facility]   Hunger Vital Sign    Worried About Running Out of Food in the Last Year: Never true    Ran Out of Food in the Last Year: Never true  Transportation Needs: No Transportation Needs (08/24/2023)   Received from [facility] - Transportation    In the past 12 months, has lack of transportation kept  you from medical appointments or from getting medications?: No    Lack of Transportation (Non-Medical): No  Physical Activity: Not on file  Stress: Not on file  Social Connections: Not on file  Intimate Partner Violence: Not on file    FAMILY HISTORY: Family History  Problem Relation Age of Onset   Cancer Mother    Diabetes Mother    Breast cancer Mother    Cancer Father    Heart disease Father    Skin cancer Father    Diabetes Sister    Cancer Brother    Diabetes Brother     ALLERGIES:  has No Known Allergies.  MEDICATIONS:  Current Outpatient Medications  Medication Sig Dispense Refill   amiodarone (PACERONE) 200 MG tablet Take by mouth.     amLODipine (NORVASC)  10 MG tablet Take 1 tablet by mouth daily.     apixaban (ELIQUIS) 5 MG TABS tablet Take 2 tabs (10 mg ) two times a day till 07/27/17 and then from 07/28/17 start 1 tab (5 mg) twice a day 60 tablet 1   latanoprost (XALATAN) 0.005 % ophthalmic solution Place 1 drop into both eyes at bedtime.     lisinopril (ZESTRIL) 40 MG tablet Take 40 mg by mouth daily.     simvastatin (ZOCOR) 20 MG tablet Take 20 mg by mouth daily.     No current facility-administered medications for this visit.    REVIEW OF SYSTEMS:   Pertinent information mentioned in HPI All other systems were reviewed with the patient and are negative.  PHYSICAL EXAMINATION: ECOG PERFORMANCE STATUS: 1 - Symptomatic but completely ambulatory  Vitals:   08/24/23 1346  BP: 137/72  Pulse: 60  Temp: (!) 97.3 F (36.3 C)  SpO2: 97%   Filed Weights   08/24/23 1346  Weight: 241 lb (109.3 kg)    GENERAL:alert, no distress and comfortable SKIN: skin color, texture, turgor are normal, no rashes or significant lesions EYES: normal, conjunctiva are pink and non-injected, sclera clear OROPHARYNX:no exudate, no erythema and lips, buccal mucosa, and tongue normal  NECK: supple, thyroid normal size, non-tender, without nodularity LYMPH:  no palpable  lymphadenopathy in the cervical, axillary or inguinal LUNGS: clear to auscultation and percussion with normal breathing effort HEART: regular rate & rhythm and no murmurs and no lower extremity edema ABDOMEN:abdomen soft, non-tender and normal bowel sounds Musculoskeletal:no cyanosis of digits and no clubbing  PSYCH: alert & oriented x 3 with fluent speech NEURO: no focal motor/sensory deficits  LABORATORY DATA:  I have reviewed the data as listed Lab Results  Component Value Date   WBC 6.2 05/20/2023   HGB 15.9 05/20/2023   HCT 47.2 05/20/2023   MCV 86.6 05/20/2023   PLT 217 05/20/2023   Recent Labs    05/20/23 2116  NA 136  K 4.6  CL 104  CO2 22  GLUCOSE 124*  BUN 24*  CREATININE 1.94*  CALCIUM 8.9  GFRNONAA 36*  PROT 7.5  ALBUMIN 4.1  AST 30  ALT 38  ALKPHOS 73  BILITOT 0.8    RADIOGRAPHIC STUDIES: I have personally reviewed the radiological images as listed and agreed with the findings in the report. MR Thoracic Spine W Wo Contrast  Result Date: 08/24/2023 CLINICAL DATA:  Follow-up T12 vertebral lesion. History of prostate cancer and melanoma. EXAM: MRI THORACIC WITHOUT AND WITH CONTRAST TECHNIQUE: Multiplanar and multiecho pulse sequences of the thoracic spine were obtained without and with intravenous contrast. CONTRAST:  10mL GADAVIST GADOBUTROL 1 MMOL/ML IV SOLN COMPARISON:  CT chest 05/20/2023. Thoracic MRI 02/13/2023. PET scan 09/09/2022. MRI abdomen 08/18/2022. Lumbar MRI 04/23/2020. FINDINGS: Alignment:  Normal Vertebrae: Previously seen benign appearing hemangiomas within T9 and T11 are unchanged. Old central superior endplate depression fracture is unchanged and healed without residual edema. Well-circumscribed enhancing lesion of the left side of the T12 vertebral body is slowly enlarging over time. Maximal dimension presently is 19 mm, about 2 mm larger than was seen in May of this year. This remains well-circumscribed. The internal characteristics appear  homogeneous on all pulse sequences. I do think that we are truly dealing with enhancement rather than signal related to proteinaceous fluid, based on the axial T1 weighted imaging which was done with identical pole sequences other than the administration of contrast. Therefore, this is presumed represent a slowly  growing metastasis. No second lesion is seen in the region. Cord: No cord compression or focal cord lesion. Mild prominence of the central canal, not significant. Paraspinal and other soft tissues: Negative Disc levels: No significant disc level pathology. Small chronic disc protrusion at the T7-8 level without neural compression. IMPRESSION: 1. Slowly enlarging enhancing lesion of the left side of the T12 vertebral body. Maximal dimension presently is 19 mm, about 2 mm larger than was seen in May of this year. I do think that we are truly dealing with enhancement rather than signal related to proteinaceous fluid, based on the axial T1 weighted imaging which was done with identical pulse sequences other than the administration of contrast. 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 No second lesion is seen in the region. 2. Old central superior endplate depression fracture of T12, unchanged and healed without residual edema. 3. Benign hemangiomas within T9 and T11, unchanged. 4. Small chronic disc protrusion at the T7-8 level without neural compression. Electronically Signed   By: Paulina Fusi M.D.   On: 08/24/2023 13:17

## 2023-08-25 LAB — KAPPA/LAMBDA LIGHT CHAINS
Kappa free light chain: 33.5 mg/L — ABNORMAL HIGH (ref 3.3–19.4)
Kappa, lambda light chain ratio: 1.97 — ABNORMAL HIGH (ref 0.26–1.65)
Lambda free light chains: 17 mg/L (ref 5.7–26.3)

## 2023-08-26 LAB — IMMUNOFIXATION, URINE

## 2023-08-27 ENCOUNTER — Other Ambulatory Visit: Payer: Commercial Managed Care - PPO

## 2023-08-28 ENCOUNTER — Telehealth: Payer: Self-pay | Admitting: Internal Medicine

## 2023-08-28 DIAGNOSIS — M899 Disorder of bone, unspecified: Secondary | ICD-10-CM

## 2023-08-28 NOTE — Telephone Encounter (Signed)
Call patient and inform about the tumor conference results.  Imaging was reviewed and overall the T12 lesion looks indolent.  Multiple myeloma panel is pending.  Kappa light chain is mildly elevated with ratio 1.97 which is not very impressive.  He does have history of CKD since 2018 which has been stable which may explain his mildly elevated light chain ratios.  Repeat biopsy was not recommended since the biopsy done in November 2023 did went into the lesion and it was negative.  There is no clear consensus on repeating imaging.  Patient does feel comfortable repeating imaging in 6 months which we will schedule.  I will follow-up with him in 6 months with labs and to discuss MRI.  Staff message sent for scheduling

## 2023-08-30 LAB — MULTIPLE MYELOMA PANEL, SERUM
Albumin SerPl Elph-Mcnc: 3.9 g/dL (ref 2.9–4.4)
Albumin/Glob SerPl: 1.3 (ref 0.7–1.7)
Alpha 1: 0.2 g/dL (ref 0.0–0.4)
Alpha2 Glob SerPl Elph-Mcnc: 1 g/dL (ref 0.4–1.0)
B-Globulin SerPl Elph-Mcnc: 1.1 g/dL (ref 0.7–1.3)
Gamma Glob SerPl Elph-Mcnc: 0.8 g/dL (ref 0.4–1.8)
Globulin, Total: 3.1 g/dL (ref 2.2–3.9)
IgA: 307 mg/dL (ref 61–437)
IgG (Immunoglobin G), Serum: 883 mg/dL (ref 603–1613)
IgM (Immunoglobulin M), Srm: 75 mg/dL (ref 15–143)
Total Protein ELP: 7 g/dL (ref 6.0–8.5)

## 2023-08-31 ENCOUNTER — Other Ambulatory Visit: Payer: Self-pay | Admitting: *Deleted

## 2023-08-31 DIAGNOSIS — M899 Disorder of bone, unspecified: Secondary | ICD-10-CM

## 2023-10-01 ENCOUNTER — Ambulatory Visit: Payer: Medicare Other | Admitting: Urology

## 2023-10-08 ENCOUNTER — Ambulatory Visit: Payer: Medicare Other | Admitting: Urology

## 2023-10-08 ENCOUNTER — Encounter: Payer: Self-pay | Admitting: Urology

## 2023-10-08 VITALS — BP 151/78 | HR 61 | Ht 70.0 in | Wt 230.0 lb

## 2023-10-08 DIAGNOSIS — C61 Malignant neoplasm of prostate: Secondary | ICD-10-CM

## 2023-10-08 DIAGNOSIS — N2889 Other specified disorders of kidney and ureter: Secondary | ICD-10-CM

## 2023-10-08 DIAGNOSIS — Z8546 Personal history of malignant neoplasm of prostate: Secondary | ICD-10-CM

## 2023-10-08 NOTE — Progress Notes (Signed)
   10/08/2023 2:46 PM   RAMIE ERMAN [birth year] [id number]  Reason for visit: Follow up history of prostate cancer, right renal mass on surveillance  HPI: 78 year old male previously treated with radical prostatectomy for prostate cancer by Dr. Ike in 2006, PSA has remained undetectable, including most recently from April 2024.  He also has a 1 cm exophytic subtly enhancing mass in the lower pole of the right kidney that has been unchanged on CT since 2018, and he continues on active surveillance.  Interestingly, he had an MRI of the abdomen in November 2023 that showed a possible T12 vertebral lesion worrisome for metastatic disease, biopsy was negative, PET scan was negative, and he continues to follow-up for routine imaging with oncology.  Very unlikely to be related to his history of prostate cancer with undetectable PSA or stable 1 cm right renal mass.  He has a history of some intermittent microscopic hematuria.  Denies any gross hematuria.  I personally reviewed and interpreted the most recent CT from August 2024, right lower pole renal mass is stable in size from prior MRI, no other suspicious lesions, no bladder abnormalities.  We discussed considering cystoscopy for his history of microscopic hematuria, however we also discussed the risk of worsening urinary symptoms or incontinence with his history of prostatectomy.  Using shared decision making he deferred cystoscopy which I think is reasonable.  We discussed the low, but nonzero, risk of missing a clinically significant malignancy by deferring cystoscopy.  -Continue active surveillance for small right renal mass stable since 2018, RTC 1 year, continues to get imaging with oncology -Can continue yearly PSA monitoring, very low risk of recurrence now that he is almost 20 years out from definitive treatment and PSA remains undetectable -Consider cystoscopy if develops gross hematuria -RTC 1 year  [name], MD  [facility]  Urology [address], [address] [place], [place] [address] [phone]

## 2023-10-09 ENCOUNTER — Encounter: Payer: Self-pay | Admitting: Internal Medicine

## 2023-10-28 ENCOUNTER — Ambulatory Visit: Payer: Medicare Other | Admitting: Registered Nurse

## 2023-10-28 ENCOUNTER — Ambulatory Visit
Admission: RE | Admit: 2023-10-28 | Discharge: 2023-10-28 | Disposition: A | Discharge status: Home / Self Care | Payer: Medicare Other | Attending: Internal Medicine | Admitting: Internal Medicine

## 2023-10-28 ENCOUNTER — Encounter
Admission: RE | Disposition: A | Discharge status: Home / Self Care | Payer: Self-pay | Source: Home / Self Care | Attending: Internal Medicine

## 2023-10-28 DIAGNOSIS — D123 Benign neoplasm of transverse colon: Secondary | ICD-10-CM | POA: Insufficient documentation

## 2023-10-28 DIAGNOSIS — Z9079 Acquired absence of other genital organ(s): Secondary | ICD-10-CM | POA: Diagnosis not present

## 2023-10-28 DIAGNOSIS — I480 Paroxysmal atrial fibrillation: Secondary | ICD-10-CM | POA: Diagnosis not present

## 2023-10-28 DIAGNOSIS — E785 Hyperlipidemia, unspecified: Secondary | ICD-10-CM | POA: Diagnosis not present

## 2023-10-28 DIAGNOSIS — I129 Hypertensive chronic kidney disease with stage 1 through stage 4 chronic kidney disease, or unspecified chronic kidney disease: Secondary | ICD-10-CM | POA: Diagnosis not present

## 2023-10-28 DIAGNOSIS — Z800 Family history of malignant neoplasm of digestive organs: Secondary | ICD-10-CM | POA: Diagnosis not present

## 2023-10-28 DIAGNOSIS — Z8546 Personal history of malignant neoplasm of prostate: Secondary | ICD-10-CM | POA: Insufficient documentation

## 2023-10-28 DIAGNOSIS — K5730 Diverticulosis of large intestine without perforation or abscess without bleeding: Secondary | ICD-10-CM | POA: Insufficient documentation

## 2023-10-28 DIAGNOSIS — Z1211 Encounter for screening for malignant neoplasm of colon: Secondary | ICD-10-CM | POA: Diagnosis present

## 2023-10-28 DIAGNOSIS — N183 Chronic kidney disease, stage 3 unspecified: Secondary | ICD-10-CM | POA: Insufficient documentation

## 2023-10-28 DIAGNOSIS — D128 Benign neoplasm of rectum: Secondary | ICD-10-CM | POA: Diagnosis not present

## 2023-10-28 DIAGNOSIS — Z86718 Personal history of other venous thrombosis and embolism: Secondary | ICD-10-CM | POA: Diagnosis not present

## 2023-10-28 DIAGNOSIS — D124 Benign neoplasm of descending colon: Secondary | ICD-10-CM | POA: Insufficient documentation

## 2023-10-28 DIAGNOSIS — K640 First degree hemorrhoids: Secondary | ICD-10-CM | POA: Diagnosis not present

## 2023-10-28 DIAGNOSIS — I2510 Atherosclerotic heart disease of native coronary artery without angina pectoris: Secondary | ICD-10-CM | POA: Diagnosis not present

## 2023-10-28 DIAGNOSIS — Z85038 Personal history of other malignant neoplasm of large intestine: Secondary | ICD-10-CM | POA: Insufficient documentation

## 2023-10-28 DIAGNOSIS — Z86711 Personal history of pulmonary embolism: Secondary | ICD-10-CM | POA: Diagnosis not present

## 2023-10-28 DIAGNOSIS — R195 Other fecal abnormalities: Secondary | ICD-10-CM | POA: Insufficient documentation

## 2023-10-28 DIAGNOSIS — I714 Abdominal aortic aneurysm, without rupture, unspecified: Secondary | ICD-10-CM | POA: Insufficient documentation

## 2023-10-28 HISTORY — PX: POLYPECTOMY: SHX5525

## 2023-10-28 HISTORY — DX: Other specified disorders of kidney and ureter: N28.89

## 2023-10-28 HISTORY — DX: Proteinuria, unspecified: R80.9

## 2023-10-28 HISTORY — DX: Hematuria, unspecified: R31.9

## 2023-10-28 HISTORY — PX: COLONOSCOPY WITH PROPOFOL: SHX5780

## 2023-10-28 HISTORY — DX: Chronic kidney disease, unspecified: N18.9

## 2023-10-28 SURGERY — COLONOSCOPY WITH PROPOFOL
Anesthesia: General

## 2023-10-28 MED ORDER — DEXMEDETOMIDINE HCL IN NACL 80 MCG/20ML IV SOLN
INTRAVENOUS | Status: DC | PRN
  Administered 2023-10-28: 8 ug via INTRAVENOUS

## 2023-10-28 MED ORDER — PROPOFOL 10 MG/ML IV BOLUS
INTRAVENOUS | Status: DC | PRN
  Administered 2023-10-28: 100 ug/kg/min via INTRAVENOUS
  Administered 2023-10-28 (×2): 50 mg via INTRAVENOUS

## 2023-10-28 MED ORDER — LIDOCAINE HCL (PF) 2 % IJ SOLN
INTRAMUSCULAR | Status: AC
  Filled 2023-10-28: qty 5

## 2023-10-28 MED ORDER — LIDOCAINE HCL (CARDIAC) PF 100 MG/5ML IV SOSY
PREFILLED_SYRINGE | INTRAVENOUS | Status: DC | PRN
  Administered 2023-10-28: 50 mg via INTRAVENOUS

## 2023-10-28 MED ORDER — SODIUM CHLORIDE 0.9 % IV SOLN
INTRAVENOUS | Status: DC
  Administered 2023-10-28: 20 mL/h via INTRAVENOUS

## 2023-10-28 MED ORDER — PROPOFOL 10 MG/ML IV BOLUS
INTRAVENOUS | Status: AC
  Filled 2023-10-28: qty 20

## 2023-10-28 NOTE — Op Note (Addendum)
[facility] Gastroenterology Patient Name: Darin Fisher Procedure Date: 10/28/2023 10:50 AM MRN: [id number] Account #: [id number] Date of Birth: March 13, 1946 Admit Type: Outpatient Age: 78 Room: [facility] ENDO ROOM 2 Gender: Male Note Status: Supervisor Override Instrument Name: Prentice Docker [id number] Procedure:             Colonoscopy Indications:           High risk colon cancer surveillance: Personal history                         of non-advanced adenoma, Family history of colon                         cancer in a first-degree relative before age 82 years,                         Positive fecal immunochemical test Providers:             Boykin Nearing. Norma Fredrickson MD, MD Referring MD:          Barbette Reichmann, MD (Referring MD) Medicines:             Propofol per Anesthesia Complications:         No immediate complications. Estimated blood loss: None. Procedure:             Pre-Anesthesia Assessment:                        - The risks and benefits of the procedure and the                         sedation options and risks were discussed with the                         patient. All questions were answered and informed                         consent was obtained.                        - Patient identification and proposed procedure were                         verified prior to the procedure by the nurse. The                         procedure was verified in the procedure room.                        - ASA Grade Assessment: III - A patient with severe                         systemic disease.                        - After reviewing the risks and benefits, the patient                         was deemed in satisfactory condition to undergo the  procedure.                        After obtaining informed consent, the colonoscope was                         passed under direct vision. Throughout the procedure,                         the patient's blood  pressure, pulse, and oxygen                         saturations were monitored continuously. The                         Colonoscope was introduced through the anus and                         advanced to the the terminal ileum, with                         identification of the appendiceal orifice and IC                         valve. The colonoscopy was performed without                         difficulty. The patient tolerated the procedure well.                         The quality of the bowel preparation was good. The                         ileocecal valve, appendiceal orifice, and rectum were                         photographed. Findings:      The perianal and digital rectal examinations were normal. Pertinent       negatives include normal sphincter tone and no palpable rectal lesions.      Non-bleeding internal hemorrhoids were found during retroflexion. The       hemorrhoids were Grade I (internal hemorrhoids that do not prolapse).      Many small-mouthed diverticula were found in the sigmoid colon. There       was no evidence of diverticular bleeding.      Two semi-pedunculated polyps were found in the transverse colon. The       polyps were 8 to 16 mm in size. These polyps were removed with a hot       snare. Resection and retrieval were complete.      A 10 mm polyp was found in the descending colon. The polyp was sessile.       The polyp was removed with a hot snare. Resection and retrieval were       complete.      A 12 mm polyp was found in the rectum. The polyp was semi-pedunculated.       The polyp was removed with a hot snare. Resection and retrieval were       complete.      The exam was otherwise without abnormality.  The terminal ileum appeared normal. Impression:            - Non-bleeding internal hemorrhoids.                        - Mild diverticulosis in the sigmoid colon. There was                         no evidence of diverticular bleeding.                         - Two 8 to 16 mm polyps in the transverse colon,                         removed with a hot snare. Resected and retrieved.                        - One 10 mm polyp in the descending colon, removed                         with a hot snare. Resected and retrieved.                        - One 12 mm polyp in the rectum, removed with a hot                         snare. Resected and retrieved.                        - The examination was otherwise normal.                        - The examined portion of the ileum was normal. Recommendation:        - Patient has a contact number available for                         emergencies. The signs and symptoms of potential                         delayed complications were discussed with the patient.                         Return to normal activities tomorrow. Written                         discharge instructions were provided to the patient.                        - Resume previous diet.                        - Continue present medications.                        - Resume Eliquis (apixaban) at prior dose tomorrow.                         Refer to managing physician for further adjustment of  therapy.                        - Repeat colonoscopy is recommended for surveillance.                         The colonoscopy date will be determined after                         pathology results from today's exam become available                         for review.                        - Return to GI office PRN.                        - The findings and recommendations were discussed with                         the patient. Procedure Code(s):     --- Professional ---                        [phone], Colonoscopy, flexible; with removal of                         tumor(s), polyp(s), or other lesion(s) by snare                         technique Diagnosis Code(s):     --- Professional ---                        K57.30,  Diverticulosis of large intestine without                         perforation or abscess without bleeding                        D12.8, Benign neoplasm of rectum                        D12.4, Benign neoplasm of descending colon                        D12.3, Benign neoplasm of transverse colon (hepatic                         flexure or splenic flexure)                        K64.0, First degree hemorrhoids                        Z86.010, Personal history of colonic polyps CPT copyright 2022 American Medical Association. All rights reserved. The codes documented in this report are preliminary and upon coder review may  be revised to meet current compliance requirements. Stanton Kidney MD, MD 10/28/2023 11:41:45 AM This report has been signed electronically. Number of Addenda: 0 Note Initiated On: 10/28/2023 10:50 AM Scope Withdrawal Time: 0 hours 12 minutes 35  seconds  Total Procedure Duration: 0 hours 14 minutes 52 seconds  Estimated Blood Loss:  Estimated blood loss: none. Estimated blood loss: none.      [facility]

## 2023-10-28 NOTE — Anesthesia Procedure Notes (Signed)
Procedure Name: MAC Date/Time: 10/28/2023 11:11 AM  Performed by: Lily Lovings, CRNAPre-anesthesia Checklist: Patient identified, Emergency Drugs available, Suction available and Patient being monitored Patient Re-evaluated:Patient Re-evaluated prior to induction Oxygen Delivery Method: Simple face mask Preoxygenation: Pre-oxygenation with 100% oxygen Induction Type: IV induction Comments: POM

## 2023-10-28 NOTE — Transfer of Care (Signed)
Immediate Anesthesia Transfer of Care Note  Patient: Darin Fisher  Procedure(s) Performed: COLONOSCOPY WITH PROPOFOL POLYPECTOMY  Patient Location: Endoscopy Unit  Anesthesia Type:General  Level of Consciousness: drowsy and patient cooperative  Airway & Oxygen Therapy: Patient Spontanous Breathing  Post-op Assessment: Report given to RN and Post -op Vital signs reviewed and unstable, Anesthesiologist notified  Post vital signs: Reviewed and stable  Last Vitals:  Vitals Value Taken Time  BP 101/59 10/28/23 1140  Temp 35.8 C 10/28/23 1139  Pulse 67 10/28/23 1140  Resp 16 10/28/23 1140  SpO2 97 % 10/28/23 1140    Last Pain:  Vitals:   10/28/23 1139  TempSrc: Temporal  PainSc: Asleep         Complications: No notable events documented.

## 2023-10-28 NOTE — Anesthesia Postprocedure Evaluation (Signed)
Anesthesia Post Note  Patient: Darin Fisher  Procedure(s) Performed: COLONOSCOPY WITH PROPOFOL POLYPECTOMY  Patient location during evaluation: Endoscopy Anesthesia Type: General Level of consciousness: awake and alert Pain management: pain level controlled Vital Signs Assessment: post-procedure vital signs reviewed and stable Respiratory status: spontaneous breathing, nonlabored ventilation, respiratory function stable and patient connected to nasal cannula oxygen Cardiovascular status: blood pressure returned to baseline and stable Postop Assessment: no apparent nausea or vomiting Anesthetic complications: no   No notable events documented.   Last Vitals:  Vitals:   10/28/23 1139 10/28/23 1140  BP: (!) 101/59 (!) 101/59  Pulse: 64 67  Resp: 16 16  Temp: (!) 35.8 C   SpO2: 97% 97%    Last Pain:  Vitals:   10/28/23 1139  TempSrc: Temporal  PainSc: Asleep                 Corinda Gubler

## 2023-10-28 NOTE — H&P (Signed)
Outpatient short stay form Pre-procedure 10/28/2023 10:07 AM Patrina Andreas K. Norma Fredrickson, M.D.  Primary Physician: Barbette Reichmann, M.D.  Reason for visit:  Positive FIT (fecal immunochemical test) (primary encounter diagnosis)   History of present illness:   Mr. Blakenship is a 78 yo male with CAD, paroxysmal AFib, mild aortic stenosis, AAA, HTN, HLD, DVT/PE (2018), prostate cancer s/p proctectomy (2006), asthma,glaucoma, CKD 3, renal mass, is sent for Positive FIT test. No anemia. He has a hx of adenomatous colon adenomatous colon polyps and family history of colon cancer. His most recent colonoscopy was 2014.  He reports that he has no upper or lower GI complaints. A few years ago he was on a modified keto diet and lost 48 pounds. The lack of fiber caused constipation. He is no longer on keto diet and has gained back the weight. He reports normal formed stools every day. No constipation. He did not see fresh blood when he turned in the stool sample. No current weight loss, anorexia, abdominal pain, change in bowel habits, or hematochezia.   LAST colonoscopy: 02/21/2013: Colonoscopy: Family history of colon cancer in first-degree relative, personal history of colon polyps. Impression: 2 small polyps, diverticulosis, 1 hyperplastic and 1 tubular adenoma. Repeat colonoscopy in 2019.   No current facility-administered medications for this encounter.  Medications Prior to Admission  Medication Sig Dispense Refill Last Dose/Taking   amiodarone (PACERONE) 200 MG tablet Take by mouth.      amLODipine (NORVASC) 10 MG tablet Take 1 tablet by mouth daily.      apixaban (ELIQUIS) 5 MG TABS tablet Take 2 tabs (10 mg ) two times a day till 07/27/17 and then from 07/28/17 start 1 tab (5 mg) twice a day 60 tablet 1    latanoprost (XALATAN) 0.005 % ophthalmic solution Place 1 drop into both eyes at bedtime.      lisinopril (ZESTRIL) 40 MG tablet Take 40 mg by mouth daily.      simvastatin (ZOCOR) 20 MG tablet Take 20 mg  by mouth daily.        No Known Allergies   Past Medical History:  Diagnosis Date   Asthma    Cancer (HCC)    Chronic kidney disease    Hematuria    HTN (hypertension)    Hypercholesterolemia    Prostate CA (HCC)    Proteinuria    Renal mass     Review of systems:  Otherwise negative.    Physical Exam  Gen: Alert, oriented. Appears stated age.  HEENT: [place]/AT. PERRLA. Lungs: CTA, no wheezes. CV: RR nl S1, S2. Abd: soft, benign, no masses. BS+ Ext: No edema. Pulses 2+    Planned procedures: Proceed with colonoscopy. The patient understands the nature of the planned procedure, indications, risks, alternatives and potential complications including but not limited to bleeding, infection, perforation, damage to internal organs and possible oversedation/side effects from anesthesia. The patient agrees and gives consent to proceed.  Please refer to procedure notes for findings, recommendations and patient disposition/instructions.     Nazly Digilio K. Norma Fredrickson, M.D. Gastroenterology 10/28/2023  10:07 AM

## 2023-10-28 NOTE — Interval H&P Note (Signed)
History and Physical Interval Note:  10/28/2023 10:09 AM  Darin Fisher  has presented today for surgery, with the diagnosis of R19.5 (ICD-10-CM) - Positive FIT (fecal immunochemical test) Z86.0100 (ICD-10-CM) - History of colon polyps Z80.0 (ICD-10-CM) - FH: colon cancer.  The various methods of treatment have been discussed with the patient and family. After consideration of risks, benefits and other options for treatment, the patient has consented to  Procedure(s): COLONOSCOPY WITH PROPOFOL (N/A) as a surgical intervention.  The patient's history has been reviewed, patient examined, no change in status, stable for surgery.  I have reviewed the patient's chart and labs.  Questions were answered to the patient's satisfaction.     [place], [place]

## 2023-10-28 NOTE — Anesthesia Preprocedure Evaluation (Signed)
Anesthesia Evaluation  Patient identified by MRN, date of birth, ID band Patient awake    Reviewed: Allergy & Precautions, NPO status , Patient's Chart, lab work & pertinent test results  History of Anesthesia Complications Negative for: history of anesthetic complications  Airway Mallampati: II  TM Distance: >3 FB Neck ROM: Full    Dental no notable dental hx. (+) Teeth Intact   Pulmonary asthma , neg sleep apnea, neg COPD, Patient abstained from smoking.Not current smoker, PE   Pulmonary exam normal breath sounds clear to auscultation       Cardiovascular Exercise Tolerance: Good METShypertension, Pt. on medications (-) CAD and (-) Past MI + dysrhythmias Atrial Fibrillation  Rhythm:Irregular Rate:Normal - Systolic murmurs    Neuro/Psych negative neurological ROS  negative psych ROS   GI/Hepatic ,neg GERD  ,,(+)     (-) substance abuse    Endo/Other  neg diabetes    Renal/GU Renal disease     Musculoskeletal   Abdominal   Peds  Hematology   Anesthesia Other Findings Past Medical History: No date: Asthma No date: Cancer (HCC) No date: Chronic kidney disease No date: Hematuria No date: HTN (hypertension) No date: Hypercholesterolemia No date: Prostate CA (HCC) No date: Proteinuria No date: Renal mass  Reproductive/Obstetrics                              Anesthesia Physical Anesthesia Plan  ASA: 3  Anesthesia Plan: General   Post-op Pain Management: Minimal or no pain anticipated   Induction: Intravenous  PONV Risk Score and Plan: 2 and Propofol infusion, TIVA and Ondansetron  Airway Management Planned: Nasal Cannula  Additional Equipment: None  Intra-op Plan:   Post-operative Plan:   Informed Consent: I have reviewed the patients History and Physical, chart, labs and discussed the procedure including the risks, benefits and alternatives for the proposed anesthesia  with the patient or authorized representative who has indicated his/her understanding and acceptance.     Dental advisory given  Plan Discussed with: CRNA and Surgeon  Anesthesia Plan Comments: (Discussed risks of anesthesia with patient, including possibility of difficulty with spontaneous ventilation under anesthesia necessitating airway intervention, PONV, and rare risks such as cardiac or respiratory or neurological events, and allergic reactions. Discussed the role of CRNA in patient's perioperative care. Patient understands.)         Anesthesia Quick Evaluation

## 2023-10-29 ENCOUNTER — Encounter: Payer: Self-pay | Admitting: Internal Medicine

## 2023-10-30 LAB — SURGICAL PATHOLOGY

## 2024-02-15 ENCOUNTER — Ambulatory Visit
Admission: RE | Admit: 2024-02-15 | Discharge: 2024-02-15 | Disposition: A | Discharge status: Home / Self Care | Payer: Commercial Managed Care - PPO | Source: Ambulatory Visit | Attending: Internal Medicine | Admitting: Internal Medicine

## 2024-02-15 DIAGNOSIS — M899 Disorder of bone, unspecified: Secondary | ICD-10-CM | POA: Insufficient documentation

## 2024-02-15 MED ORDER — GADOBUTROL 1 MMOL/ML IV SOLN
10.0000 mL | Freq: Once | INTRAVENOUS | Status: AC | PRN
  Administered 2024-02-15: 10 mL via INTRAVENOUS

## 2024-02-25 ENCOUNTER — Ambulatory Visit: Payer: Commercial Managed Care - PPO | Admitting: Internal Medicine

## 2024-02-25 ENCOUNTER — Other Ambulatory Visit: Payer: Commercial Managed Care - PPO

## 2024-03-01 ENCOUNTER — Ambulatory Visit: Payer: Commercial Managed Care - PPO | Admitting: Internal Medicine

## 2024-03-01 ENCOUNTER — Other Ambulatory Visit: Payer: Commercial Managed Care - PPO

## 2024-03-03 ENCOUNTER — Other Ambulatory Visit: Payer: Commercial Managed Care - PPO

## 2024-03-03 ENCOUNTER — Ambulatory Visit: Payer: Commercial Managed Care - PPO | Admitting: Internal Medicine

## 2024-03-03 ENCOUNTER — Inpatient Hospital Stay

## 2024-03-03 ENCOUNTER — Inpatient Hospital Stay: Admitting: Oncology

## 2024-03-11 ENCOUNTER — Inpatient Hospital Stay (HOSPITAL_BASED_OUTPATIENT_CLINIC_OR_DEPARTMENT_OTHER): Admitting: Oncology

## 2024-03-11 ENCOUNTER — Inpatient Hospital Stay: Attending: Oncology

## 2024-03-11 ENCOUNTER — Encounter: Payer: Self-pay | Admitting: Oncology

## 2024-03-11 VITALS — BP 149/70 | HR 47 | Temp 98.6°F | Resp 19 | Wt 226.3 lb

## 2024-03-11 DIAGNOSIS — N289 Disorder of kidney and ureter, unspecified: Secondary | ICD-10-CM

## 2024-03-11 DIAGNOSIS — M899 Disorder of bone, unspecified: Secondary | ICD-10-CM | POA: Insufficient documentation

## 2024-03-11 DIAGNOSIS — Z8546 Personal history of malignant neoplasm of prostate: Secondary | ICD-10-CM | POA: Insufficient documentation

## 2024-03-11 DIAGNOSIS — Z808 Family history of malignant neoplasm of other organs or systems: Secondary | ICD-10-CM | POA: Insufficient documentation

## 2024-03-11 DIAGNOSIS — Z803 Family history of malignant neoplasm of breast: Secondary | ICD-10-CM | POA: Diagnosis not present

## 2024-03-11 LAB — CBC WITH DIFFERENTIAL/PLATELET
Abs Immature Granulocytes: 0.02 10*3/uL (ref 0.00–0.07)
Basophils Absolute: 0.1 10*3/uL (ref 0.0–0.1)
Basophils Relative: 1 %
Eosinophils Absolute: 0.3 10*3/uL (ref 0.0–0.5)
Eosinophils Relative: 4 %
HCT: 47.4 % (ref 39.0–52.0)
Hemoglobin: 15.8 g/dL (ref 13.0–17.0)
Immature Granulocytes: 0 %
Lymphocytes Relative: 23 %
Lymphs Abs: 1.4 10*3/uL (ref 0.7–4.0)
MCH: 29.3 pg (ref 26.0–34.0)
MCHC: 33.3 g/dL (ref 30.0–36.0)
MCV: 87.8 fL (ref 80.0–100.0)
Monocytes Absolute: 0.7 10*3/uL (ref 0.1–1.0)
Monocytes Relative: 11 %
Neutro Abs: 3.6 10*3/uL (ref 1.7–7.7)
Neutrophils Relative %: 61 %
Platelets: 248 10*3/uL (ref 150–400)
RBC: 5.4 MIL/uL (ref 4.22–5.81)
RDW: 13.4 % (ref 11.5–15.5)
WBC: 6 10*3/uL (ref 4.0–10.5)
nRBC: 0 % (ref 0.0–0.2)

## 2024-03-11 LAB — COMPREHENSIVE METABOLIC PANEL WITH GFR
ALT: 26 U/L (ref 0–44)
AST: 20 U/L (ref 15–41)
Albumin: 4.1 g/dL (ref 3.5–5.0)
Alkaline Phosphatase: 68 U/L (ref 38–126)
Anion gap: 10 (ref 5–15)
BUN: 30 mg/dL — ABNORMAL HIGH (ref 8–23)
CO2: 22 mmol/L (ref 22–32)
Calcium: 9.4 mg/dL (ref 8.9–10.3)
Chloride: 104 mmol/L (ref 98–111)
Creatinine, Ser: 1.87 mg/dL — ABNORMAL HIGH (ref 0.61–1.24)
GFR, Estimated: 37 mL/min — ABNORMAL LOW (ref >60)
Glucose, Bld: 111 mg/dL — ABNORMAL HIGH (ref 70–99)
Potassium: 4.6 mmol/L (ref 3.5–5.1)
Sodium: 136 mmol/L (ref 135–145)
Total Bilirubin: 0.7 mg/dL (ref 0.0–1.2)
Total Protein: 7.1 g/dL (ref 6.5–8.1)

## 2024-03-11 NOTE — Progress Notes (Signed)
 [facility]  Telephone:(336) [phone] Fax:(336) [phone]  ID: Darin Fisher OB: [birth year]  MR#: [id number]  UXL#:[id number]  Patient Care Team: Antonio Baumgarten, MD as PCP - General (Internal Medicine) Antonette Batters, MD as Consulting Physician (Cardiology) Shellie Dials, MD as Consulting Physician (Oncology)  CHIEF COMPLAINT: T12 bony lesion  INTERVAL HISTORY: Patient recently followed by another provider.  He returns to clinic today for routine 8-month evaluation and discussion of his imaging results.  He continues to feel well and remains asymptomatic.  He has not complained of any pain.  He has no neurologic complaints.  He denies any recent fevers or illnesses.  He has a good appetite and denies weight loss.  He has no chest pain, shortness of breath, cough, or hemoptysis.  He denies any nausea, vomiting, constipation, or diarrhea.  He has no urinary complaints.  Patient feels at his baseline and offers no specific complaints today.  REVIEW OF SYSTEMS:   Review of Systems  Constitutional: Negative.  Negative for fever, malaise/fatigue and weight loss.  Respiratory: Negative.  Negative for cough, hemoptysis and shortness of breath.   Cardiovascular: Negative.  Negative for chest pain and leg swelling.  Gastrointestinal: Negative.  Negative for abdominal pain.  Genitourinary: Negative.  Negative for dysuria.  Musculoskeletal: Negative.  Negative for back pain.  Skin: Negative.  Negative for rash.  Neurological: Negative.  Negative for dizziness, focal weakness, weakness and headaches.  Psychiatric/Behavioral: Negative.  The patient is not nervous/anxious.     As per HPI. Otherwise, a complete review of systems is negative.  PAST MEDICAL HISTORY: Past Medical History:  Diagnosis Date   Asthma    Cancer (HCC)    Chronic kidney disease    Hematuria    HTN (hypertension)    Hypercholesterolemia    Prostate CA (HCC)    Proteinuria    Renal mass      PAST SURGICAL HISTORY: Past Surgical History:  Procedure Laterality Date   CATARACT EXTRACTION Bilateral    COLONOSCOPY WITH PROPOFOL  N/A 10/28/2023   Procedure: COLONOSCOPY WITH PROPOFOL ;  Surgeon: Toledo, Alphonsus Jeans, MD;  Location: [facility] ENDOSCOPY;  Service: Gastroenterology;  Laterality: N/A;   EYE SURGERY     MELANOMA REMOVAL, LEFT WRIST     POLYPECTOMY  10/28/2023   Procedure: POLYPECTOMY;  Surgeon: Toledo, Alphonsus Jeans, MD;  Location: [facility] ENDOSCOPY;  Service: Gastroenterology;;   PROSTATE SURGERY      FAMILY HISTORY: Family History  Problem Relation Age of Onset   Cancer Mother    Diabetes Mother    Breast cancer Mother    Cancer Father    Heart disease Father    Skin cancer Father    Diabetes Sister    Cancer Brother    Diabetes Brother     ADVANCED DIRECTIVES (Y/N):  N  HEALTH MAINTENANCE: Social History   Tobacco Use   Smoking status: Never    Passive exposure: Never   Smokeless tobacco: Never  Vaping Use   Vaping status: Never Used  Substance Use Topics   Alcohol use: No   Drug use: No     Colonoscopy:  PAP:  Bone density:  Lipid panel:  No Known Allergies  Current Outpatient Medications  Medication Sig Dispense Refill   amiodarone (PACERONE) 200 MG tablet Take by mouth.     amLODipine (NORVASC) 10 MG tablet Take 1 tablet by mouth daily.     apixaban  (ELIQUIS ) 5 MG TABS tablet Take 2 tabs (10 mg ) two  times a day till 07/27/17 and then from 07/28/17 start 1 tab (5 mg) twice a day 60 tablet 1   Cholecalciferol 250 MCG (10000 UT) CAPS Take 10,000 Units by mouth.     empagliflozin (JARDIANCE) 25 MG TABS tablet      latanoprost  (XALATAN ) 0.005 % ophthalmic solution Place 1 drop into both eyes at bedtime.     lisinopril  (ZESTRIL ) 40 MG tablet Take 40 mg by mouth daily.     simvastatin  (ZOCOR ) 20 MG tablet Take 20 mg by mouth daily.     No current facility-administered medications for this visit.    OBJECTIVE: Vitals:   03/11/24 0958  BP: (!)  149/70  Pulse: (!) 47  Resp: 19  Temp: 98.6 F (37 C)  SpO2: 99%     Body mass index is 32.47 kg/m.    ECOG FS:0 - Asymptomatic  General: Well-developed, well-nourished, no acute distress. Eyes: Pink conjunctiva, anicteric sclera. HEENT: Normocephalic, moist mucous membranes. Lungs: No audible wheezing or coughing. Heart: Regular rate and rhythm. Abdomen: Soft, nontender, no obvious distention. Musculoskeletal: No edema, cyanosis, or clubbing. Neuro: Alert, answering all questions appropriately. Cranial nerves grossly intact. Skin: No rashes or petechiae noted. Psych: Normal affect. Lymphatics: No cervical, calvicular, axillary or inguinal LAD.   LAB RESULTS:  Lab Results  Component Value Date   NA 136 03/11/2024   K 4.6 03/11/2024   CL 104 03/11/2024   CO2 22 03/11/2024   GLUCOSE 111 (H) 03/11/2024   BUN 30 (H) 03/11/2024   CREATININE 1.87 (H) 03/11/2024   CALCIUM 9.4 03/11/2024   PROT 7.1 03/11/2024   ALBUMIN 4.1 03/11/2024   AST 20 03/11/2024   ALT 26 03/11/2024   ALKPHOS 68 03/11/2024   BILITOT 0.7 03/11/2024   GFRNONAA 37 (L) 03/11/2024   GFRAA 57 (L) 07/21/2017    Lab Results  Component Value Date   WBC 6.0 03/11/2024   NEUTROABS 3.6 03/11/2024   HGB 15.8 03/11/2024   HCT 47.4 03/11/2024   MCV 87.8 03/11/2024   PLT 248 03/11/2024     STUDIES: MR Thoracic Spine W Wo Contrast Result Date: 03/11/2024 CLINICAL DATA:  78 year old male with a history of prostate cancer and melanoma, 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 Subsequent encounter. EXAM: MRI THORACIC WITHOUT AND WITH CONTRAST TECHNIQUE: Multiplanar and multiecho pulse sequences of the thoracic spine were obtained without and with intravenous contrast. CONTRAST:  10mL GADAVIST  GADOBUTROL  1 MMOL/ML IV SOLN COMPARISON:  Thoracic MRI 08/17/2023 and earlier. FINDINGS: Limited cervical spine imaging: Stable since May of last year, degenerative changes most notably  the left C4-C5 facets. Thoracic spine segmentation: Previously confirmed as normal, same numbering system used last year. Alignment:  Stable thoracic kyphosis. Vertebrae: Generally normal background bone marrow signal including benign vertebral body hemangiomas at T9 and T11. Outside of T12, no thoracic marrow edema, enhancement, or suspicious findings. Incidental pronounced degenerative thoracic endplate spurring anteriorly and to the right at T10-T11. T12 level remains abnormal. Central collapse of the T12 superior endplate is stable, thought at least in part post biopsy related. The oval, rounded T1 hypointense lesion centered at the posterior left body and tracking into the pedicle has increased since last year from a long axis of about 17 mm 02/13/2023, to 22 mm 02/15/2024 (compare series 18, image 13 now the series 20, image 12 previously). And there is fairly intense enhancement following contrast on series 24, image 14 currently. 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 No thoracic spinal cord edema, myelomalacia, abnormal enhancement. No dural thickening or enhancement. Paraspinal and other soft tissues: Faint degenerative appearing soft tissue reactive changes adjacent to the bulky right anterior endplate osteophyte at T10-T11. No other thoracic paraspinal inflammation. No paraspinal soft tissue mass. Grossly stable visible chest and upper abdominal viscera. Disc levels: Stable including occasional ordinary thoracic disc protrusions (such as T7-T8). No significant thoracic spinal stenosis. 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 But no new or additional metastatic disease is identified in the thoracic spine. And no new osseous abnormality. Electronically Signed   By: Marlise Simpers M.D.   On: 03/11/2024 09:08     ASSESSMENT: T12 bony lesion.  PLAN:    T12 bony lesion: MRI results from March 11, 2024 reviewed independently and reported as above.  Lesion remains suspicious for a slowly growing metastasis and is about 5 mm larger than previous imaging in May 2024.  No new or additional metastatic disease is identified.  PET scan on September 09, 2022 did not reveal any hypermetabolism.  Myeloma workup was essentially unrevealing other than a mildly elevated kappa kappa free light chain.  Patient is asymptomatic, therefore will continue active surveillance.  Return to clinic in 6 months with repeat MRI and further evaluation. History of prostate cancer: Patient's most recent PSA remains undetectable.  Continue monitoring per urology. Right kidney lesion: Reportedly stable since at least 2018.  MRI from August 18, 2022 revealed a heterogeneous solid lesion measuring 1 x 1.2 cm.  Interestingly, CT scan on May 20, 2023 did not make any mention of lesion.  Will repeat MRI in 6 months along with thoracic spine MRI as above. Elevated kappa chains: Patient's most recent value was 33.5.  Today's result is pending.  Immunoglobulins are within normal limits and the patient does not have an M spike on SPEP.  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 Renal insufficiency: Patient's most recent creatinine is 1.7.  Monitor.  Patient expressed understanding and was in agreement with this plan. He also understands that He can call clinic at any time with any questions, concerns, or complaints.    Cancer Staging  No matching staging information was found for the patient.   Shellie Dials, MD   03/11/2024 1:47 PM

## 2024-03-14 LAB — MULTIPLE MYELOMA PANEL, SERUM
Albumin SerPl Elph-Mcnc: 3.8 g/dL (ref 2.9–4.4)
Albumin/Glob SerPl: 1.3 (ref 0.7–1.7)
Alpha 1: 0.2 g/dL (ref 0.0–0.4)
Alpha2 Glob SerPl Elph-Mcnc: 0.9 g/dL (ref 0.4–1.0)
B-Globulin SerPl Elph-Mcnc: 1.2 g/dL (ref 0.7–1.3)
Gamma Glob SerPl Elph-Mcnc: 0.8 g/dL (ref 0.4–1.8)
Globulin, Total: 3 g/dL (ref 2.2–3.9)
IgA: 304 mg/dL (ref 61–437)
IgG (Immunoglobin G), Serum: 987 mg/dL (ref 603–1613)
IgM (Immunoglobulin M), Srm: 72 mg/dL (ref 15–143)
Total Protein ELP: 6.8 g/dL (ref 6.0–8.5)

## 2024-03-14 LAB — KAPPA/LAMBDA LIGHT CHAINS
Kappa free light chain: 34.8 mg/L — ABNORMAL HIGH (ref 3.3–19.4)
Kappa, lambda light chain ratio: 1.91 — ABNORMAL HIGH (ref 0.26–1.65)
Lambda free light chains: 18.2 mg/L (ref 5.7–26.3)

## 2024-05-19 ENCOUNTER — Encounter: Payer: Self-pay | Admitting: Urology

## 2024-09-09 ENCOUNTER — Inpatient Hospital Stay: Attending: Oncology

## 2024-09-09 ENCOUNTER — Ambulatory Visit: Admission: RE | Admit: 2024-09-09 | Discharge: 2024-09-09 | Attending: Oncology | Admitting: Oncology

## 2024-09-09 DIAGNOSIS — R778 Other specified abnormalities of plasma proteins: Secondary | ICD-10-CM | POA: Insufficient documentation

## 2024-09-09 DIAGNOSIS — M899 Disorder of bone, unspecified: Secondary | ICD-10-CM | POA: Insufficient documentation

## 2024-09-09 DIAGNOSIS — Z8546 Personal history of malignant neoplasm of prostate: Secondary | ICD-10-CM | POA: Insufficient documentation

## 2024-09-09 DIAGNOSIS — N289 Disorder of kidney and ureter, unspecified: Secondary | ICD-10-CM

## 2024-09-09 LAB — CBC WITH DIFFERENTIAL/PLATELET
Abs Immature Granulocytes: 0.06 K/uL (ref 0.00–0.07)
Basophils Absolute: 0.1 K/uL (ref 0.0–0.1)
Basophils Relative: 1 %
Eosinophils Absolute: 0.3 K/uL (ref 0.0–0.5)
Eosinophils Relative: 4 %
HCT: 47.7 % (ref 39.0–52.0)
Hemoglobin: 16.1 g/dL (ref 13.0–17.0)
Immature Granulocytes: 1 %
Lymphocytes Relative: 24 %
Lymphs Abs: 1.8 K/uL (ref 0.7–4.0)
MCH: 29.3 pg (ref 26.0–34.0)
MCHC: 33.8 g/dL (ref 30.0–36.0)
MCV: 86.9 fL (ref 80.0–100.0)
Monocytes Absolute: 0.9 K/uL (ref 0.1–1.0)
Monocytes Relative: 12 %
Neutro Abs: 4.3 K/uL (ref 1.7–7.7)
Neutrophils Relative %: 58 %
Platelets: 256 K/uL (ref 150–400)
RBC: 5.49 MIL/uL (ref 4.22–5.81)
RDW: 13.5 % (ref 11.5–15.5)
WBC: 7.4 K/uL (ref 4.0–10.5)
nRBC: 0 % (ref 0.0–0.2)

## 2024-09-09 LAB — CMP (CANCER CENTER ONLY)
ALT: 35 U/L (ref 0–44)
AST: 26 U/L (ref 15–41)
Albumin: 4.2 g/dL (ref 3.5–5.0)
Alkaline Phosphatase: 74 U/L (ref 38–126)
Anion gap: 11 (ref 5–15)
BUN: 24 mg/dL — ABNORMAL HIGH (ref 8–23)
CO2: 23 mmol/L (ref 22–32)
Calcium: 10.1 mg/dL (ref 8.9–10.3)
Chloride: 103 mmol/L (ref 98–111)
Creatinine: 2.08 mg/dL — ABNORMAL HIGH (ref 0.61–1.24)
GFR, Estimated: 32 mL/min — ABNORMAL LOW (ref >60)
Glucose, Bld: 113 mg/dL — ABNORMAL HIGH (ref 70–99)
Potassium: 4.9 mmol/L (ref 3.5–5.1)
Sodium: 138 mmol/L (ref 135–145)
Total Bilirubin: 0.4 mg/dL (ref 0.0–1.2)
Total Protein: 7.4 g/dL (ref 6.5–8.1)

## 2024-09-09 MED ORDER — GADOBUTROL 1 MMOL/ML IV SOLN
10.0000 mL | Freq: Once | INTRAVENOUS | Status: AC | PRN
  Administered 2024-09-09: 10 mL via INTRAVENOUS

## 2024-09-12 LAB — KAPPA/LAMBDA LIGHT CHAINS
Kappa free light chain: 45.3 mg/L — ABNORMAL HIGH (ref 3.3–19.4)
Kappa, lambda light chain ratio: 1.8 — ABNORMAL HIGH (ref 0.26–1.65)
Lambda free light chains: 25.2 mg/L (ref 5.7–26.3)

## 2024-09-16 ENCOUNTER — Inpatient Hospital Stay: Admitting: Oncology

## 2024-09-16 ENCOUNTER — Encounter: Payer: Self-pay | Admitting: Oncology

## 2024-09-16 VITALS — BP 157/75 | HR 54 | Temp 96.9°F | Resp 18 | Wt 233.0 lb

## 2024-09-16 DIAGNOSIS — M899 Disorder of bone, unspecified: Secondary | ICD-10-CM

## 2024-09-16 DIAGNOSIS — N289 Disorder of kidney and ureter, unspecified: Secondary | ICD-10-CM | POA: Diagnosis not present

## 2024-09-16 DIAGNOSIS — R935 Abnormal findings on diagnostic imaging of other abdominal regions, including retroperitoneum: Secondary | ICD-10-CM

## 2024-09-16 NOTE — Progress Notes (Signed)
 Patient had a couple of MRI's on 07/10/2024. He has been having some middle back pain that comes and goes he rates his pain at a 4 when it is hurting him the worst.

## 2024-09-16 NOTE — Progress Notes (Signed)
 [facility]  [phone] Fax:([phone]  ID: Darin Fisher OB: 1946/05/18  MR#: [id number]  RDW#:[id number]  Patient Care Team: Sadie Manna, MD as PCP - General (Internal Medicine) Florencio Cara BIRCH, MD as Consulting Physician (Cardiology) Jacobo Evalene PARAS, MD as Consulting Physician (Oncology)  CHIEF COMPLAINT: T12 bony lesion  INTERVAL HISTORY: Patient returns to clinic today for repeat laboratory work, routine 109-month follow-up, and discussion of his imaging results.  He continues to feel well and remains asymptomatic.  He has no neurologic complaints.  He denies any recent fevers or illnesses.  He has a good appetite and denies weight loss.  He has no chest pain, shortness of breath, cough, or hemoptysis.  He denies any nausea, vomiting, constipation, or diarrhea.  He has no urinary complaints.  Patient offers no specific complaints today.  REVIEW OF SYSTEMS:   Review of Systems  Constitutional: Negative.  Negative for fever, malaise/fatigue and weight loss.  Respiratory: Negative.  Negative for cough, hemoptysis and shortness of breath.   Cardiovascular: Negative.  Negative for chest pain and leg swelling.  Gastrointestinal: Negative.  Negative for abdominal pain.  Genitourinary: Negative.  Negative for dysuria.  Musculoskeletal: Negative.  Negative for back pain.  Skin: Negative.  Negative for rash.  Neurological: Negative.  Negative for dizziness, focal weakness, weakness and headaches.  Psychiatric/Behavioral: Negative.  The patient is not nervous/anxious.     As per HPI. Otherwise, a complete review of systems is negative.  PAST MEDICAL HISTORY: Past Medical History:  Diagnosis Date   Asthma    Cancer (HCC)    Chronic kidney disease    Hematuria    HTN (hypertension)    Hypercholesterolemia    Prostate CA (HCC)    Proteinuria    Renal mass     PAST SURGICAL HISTORY: Past Surgical History:  Procedure Laterality Date    CATARACT EXTRACTION Bilateral    COLONOSCOPY WITH PROPOFOL  N/A 10/28/2023   Procedure: COLONOSCOPY WITH PROPOFOL ;  Surgeon: Toledo, Ladell POUR, MD;  Location: [facility] ENDOSCOPY;  Service: Gastroenterology;  Laterality: N/A;   EYE SURGERY     MELANOMA REMOVAL, LEFT WRIST     POLYPECTOMY  10/28/2023   Procedure: POLYPECTOMY;  Surgeon: Toledo, Ladell POUR, MD;  Location: [facility] ENDOSCOPY;  Service: Gastroenterology;;   PROSTATE SURGERY      FAMILY HISTORY: Family History  Problem Relation Age of Onset   Cancer Mother    Diabetes Mother    Breast cancer Mother    Cancer Father    Heart disease Father    Skin cancer Father    Diabetes Sister    Cancer Brother    Diabetes Brother     ADVANCED DIRECTIVES (Y/N):  N  HEALTH MAINTENANCE: Social History   Tobacco Use   Smoking status: Never    Passive exposure: Never   Smokeless tobacco: Never  Vaping Use   Vaping status: Never Used  Substance Use Topics   Alcohol use: No   Drug use: No     Colonoscopy:  PAP:  Bone density:  Lipid panel:  No Known Allergies  Current Outpatient Medications  Medication Sig Dispense Refill   amiodarone (PACERONE) 200 MG tablet Take by mouth.     amLODipine (NORVASC) 10 MG tablet Take 1 tablet by mouth daily.     apixaban  (ELIQUIS ) 5 MG TABS tablet Take 2 tabs (10 mg ) two times a day till 07/27/17 and then from 07/28/17 start 1 tab (5 mg) twice a day  60 tablet 1   Cholecalciferol 250 MCG (10000 UT) CAPS Take 10,000 Units by mouth.     empagliflozin (JARDIANCE) 25 MG TABS tablet      latanoprost  (XALATAN ) 0.005 % ophthalmic solution Place 1 drop into both eyes at bedtime.     lisinopril  (ZESTRIL ) 40 MG tablet Take 40 mg by mouth daily.     simvastatin  (ZOCOR ) 20 MG tablet Take 20 mg by mouth daily.     No current facility-administered medications for this visit.    OBJECTIVE: Vitals:   09/16/24 0935  BP: (!) 157/75  Pulse: (!) 54  Resp: 18  Temp: (!) 96.9 F (36.1 C)  SpO2: 99%      Body mass index is 33.43 kg/m.    ECOG FS:0 - Asymptomatic  General: Well-developed, well-nourished, no acute distress. Eyes: Pink conjunctiva, anicteric sclera. HEENT: Normocephalic, moist mucous membranes. Lungs: No audible wheezing or coughing. Heart: Regular rate and rhythm. Abdomen: Soft, nontender, no obvious distention. Musculoskeletal: No edema, cyanosis, or clubbing. Neuro: Alert, answering all questions appropriately. Cranial nerves grossly intact. Skin: No rashes or petechiae noted. Psych: Normal affect.  LAB RESULTS:  Lab Results  Component Value Date   NA 138 09/09/2024   K 4.9 09/09/2024   CL 103 09/09/2024   CO2 23 09/09/2024   GLUCOSE 113 (H) 09/09/2024   BUN 24 (H) 09/09/2024   CREATININE 2.08 (H) 09/09/2024   CALCIUM 10.1 09/09/2024   PROT 7.4 09/09/2024   ALBUMIN 4.2 09/09/2024   AST 26 09/09/2024   ALT 35 09/09/2024   ALKPHOS 74 09/09/2024   BILITOT 0.4 09/09/2024   GFRNONAA 32 (L) 09/09/2024   GFRAA 57 (L) 07/21/2017    Lab Results  Component Value Date   WBC 7.4 09/09/2024   NEUTROABS 4.3 09/09/2024   HGB 16.1 09/09/2024   HCT 47.7 09/09/2024   MCV 86.9 09/09/2024   PLT 256 09/09/2024     STUDIES: MR Thoracic Spine W Wo Contrast Result Date: 09/16/2024 EXAM: MRI THORACIC SPINE WITHOUT AND WITH INTRAVENOUS CONTRAST 09/09/2024 10:40:06 AM TECHNIQUE: Multiplanar multisequence MRI of the thoracic spine was performed without and with the administration of intravenous contrast. COMPARISON: MR Thoracic Spine with and without IV contrast 02/15/2024; 08/17/2023; and earlier studies. CLINICAL HISTORY: 78 year old male with prostate cancer. Follow up chronic T12 vertebral lesion. FINDINGS: Limited sagittal imaging of the cervical spine is stable, within normal limits for age. BONES AND ALIGNMENT: Vertebral body heights T1-T8 are stable and normal. T1-T8 vertebral marrow signal is stable and normal. T9 benign vertebral hemangioma is stable. T9-T10 anterior  interbody ankylosis from flowing endplate osteophytes is incidentally noted. T10 vertebra appears stable and intact. T11 superior vertebral body benign hemangioma is stable. T12 demonstrates superior endplate compression. At T12, there is a chronic oval T1 hypointense and enhancing bone lesion of the posterior vertebral body tracking towards the pedicle (series 11 image 15). On axial images, this chronic bone lesion measures 12 mm transverse (series 14 image 39), stable. On sagittal images, the lesion measures 21 mm long axis (stable, versus 22 mm in May). The size and configuration now appears unchanged. Intense enhancement of the lesion is redemonstrated (series 17 image 15) with central STIR hyperintensity, but no surrounding marrow edema. No new thoracic vertebral bone lesion. SPINAL CORD: Thoracic spinal cord signal and morphology are stable. Small central syrinx or physiologic prominence of the central spinal canal unchanged (series 13 image 30 at the T9-T10 level) and still is not visible on sagittal T1  imaging. No thoracic spinal cord edema or myelomalacia. Visible conus medullaris within normal limits at T12-L1. No abnormal intradural enhancement or dural thickening. SOFT TISSUES: Right lung upper lobe azygos vein and fissure redemonstrated, normal variant. Stable visible chest and upper abdomen. No thoracic paraspinal soft tissue mass. DEGENERATIVE: Thoracic spine degeneration, including small chronic right paracentral disc or disc osteophyte complex at T7-T8 (series 13 image 23) is stable. No significant thoracic spinal stenosis. IMPRESSION: 1. Chronic T12 vertebral body lesion now unchanged since May this year. Stable T12 superior endplate compression at that level. And no new or suspicious thoracic spinal lesion identified. 2. Stable generally mild free edge thoracic spine degeneration with no spinal stenosis. Electronically signed by: Helayne Hurst MD 09/16/2024 05:59 AM EST RP Workstation: [id number]    MR Abdomen W Wo Contrast Result Date: 09/12/2024 CLINICAL DATA:  Follow-up renal lesion. EXAM: MRI ABDOMEN WITHOUT AND WITH CONTRAST TECHNIQUE: Multiplanar multisequence MR imaging of the abdomen was performed both before and after the administration of intravenous contrast. CONTRAST:  10mL GADAVIST  GADOBUTROL  1 MMOL/ML IV SOLN COMPARISON:  MRI abdomen from 08/18/2022. FINDINGS: Lower chest: Unremarkable MR appearance to the lung bases. No pleural effusion. No pericardial effusion. Normal heart size. Hepatobiliary: The liver is moderately enlarged in size. Noncirrhotic configuration. There is a subcentimeter sized subcapsular simple cyst in the left hepatic lobe, segment 2. No suspicious liver lesion. There is moderate diffuse hepatic steatosis. No intrahepatic or extrahepatic bile duct dilatation. No choledocholithiasis. Unremarkable gallbladder. Pancreas: No mass, inflammatory changes or other parenchymal abnormality identified. No main pancreatic duct dilation. Spleen:  Within normal limits in size and appearance. No focal mass. Adrenals/Urinary Tract: Unremarkable adrenal glands. No hydroureteronephrosis on either side. There are multiple subcentimeter sized simple cysts throughout bilateral kidneys. Redemonstration of partially exophytic moderately enhancing 10 x 11 mm lesion arising from the right kidney lower pole, posteriorly (series 22, image 56), which appears essentially similar to the prior study. However, adjacent to this lesion, medially, there is a smaller partially exophytic structure which appears slightly more pronounced than the prior exam and also exhibit contrast enhancement. The lesion approximately measures 7 x 8 mm. Stomach/Bowel: Visualized portions within the abdomen are unremarkable. No disproportionate dilation of bowel loops. Vascular/Lymphatic: No pathologically enlarged lymph nodes identified. No abdominal aortic aneurysm demonstrated. No ascites. Other:  None. Musculoskeletal:  Previously seen enhancing lesion in the posterior aspect of T12 vertebral body on the left has increased in size measuring 10 x 22 mm on today's exam. This is concerning for worsening neoplastic process. Hemangiomas noted in the T9 and T11 vertebral bodies. IMPRESSION: 1. Redemonstration of moderately enhancing 10 x 11 mm lesion arising from the right kidney lower pole, posteriorly, which is essentially similar to the prior study. However, adjacent to this lesion, medially, there is a smaller partially exophytic structure which appears slightly more pronounced than the prior exam and also exhibit contrast enhancement. The lesion approximately measures 7 x 8 mm. This is concerning for a small enlarging renal cell carcinoma. 2. Interval increase in the size of probable metastatic lesion in the left posterior aspect of T12 vertebral body. 3. Multiple other nonacute observations, as described above. Electronically Signed   By: Ree Molt M.D.   On: 09/12/2024 16:32    ASSESSMENT: T12 bony lesion.  PLAN:    T12 bony lesion: Repeat MRI on September 09, 2024 lesion remains unchanged at approximately 21 mm on the long axis with no new or suspicious lesions reported.  PET scan on September 09, 2022 did not reveal any hypermetabolism.  Myeloma workup was essentially unrevealing other than a mildly elevated kappa kappa free light chain which is likely clinically insignificant.  Patient is asymptomatic, therefore will continue active surveillance.  Return to clinic in 6 months with repeat MRI and further evaluation.   History of prostate cancer: Patient's most recent PSA on Feb 15, 2024 remains undetectable.  Continue monitoring per urology. Right kidney lesion: Reportedly stable since at least 2018.  Repeat MRI on September 09, 2024 redemonstrated the moderately enhancing 10 x 11 mm lesion.  This is unchanged from his prior MRI in 2023.  There is also a second lesion reported measuring 7 x 8 mm also concerning for a  small enlarging renal cell carcinoma.  No further intervention needed.  Continue active surveillance and repeat MRI in 6 months.   Elevated kappa chains: Kappa chains have ranged from 33.5-45.3 since November 2024.  Today's result is 45.3.  Immunoglobulins continue to be within normal limits with a negative M spike on SPEP.  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 Renal insufficiency: Creatinine has trended up to 2.08.  Continue monitoring and treatment per primary care.  Patient expressed understanding and was in agreement with this plan. He also understands that He can call clinic at any time with any questions, concerns, or complaints.    Evalene JINNY Reusing, MD   09/16/2024 11:08 AM

## 2024-10-12 ENCOUNTER — Ambulatory Visit: Admitting: Urology

## 2024-10-12 ENCOUNTER — Encounter: Payer: Self-pay | Admitting: Urology

## 2024-10-13 ENCOUNTER — Ambulatory Visit: Payer: Self-pay | Admitting: Urology

## 2024-11-02 ENCOUNTER — Ambulatory Visit (INDEPENDENT_AMBULATORY_CARE_PROVIDER_SITE_OTHER): Admitting: Urology

## 2024-11-02 VITALS — BP 112/68 | HR 80 | Ht 70.0 in | Wt 221.0 lb

## 2024-11-02 DIAGNOSIS — C61 Malignant neoplasm of prostate: Secondary | ICD-10-CM

## 2024-11-02 DIAGNOSIS — N2889 Other specified disorders of kidney and ureter: Secondary | ICD-10-CM | POA: Diagnosis not present

## 2024-11-02 NOTE — Progress Notes (Signed)
" ° °  11/02/2024 3:45 PM   Darin Fisher [birth year] [id number]  Reason for visit: Follow up history of prostate cancer, right renal mass on surveillance, ckd   HPI: 79 year old male previously treated with radical prostatectomy for prostate cancer by Dr. Ike in 2006, PSA has remained undetectable, including most recently from May 2025.  He also has a 1 cm exophytic subtly enhancing mass in the lower pole of the right kidney that has been unchanged on CT since 2018, and he continues on active surveillance.  Interestingly, he had an MRI of the abdomen in November 2023 that showed a possible T12 vertebral lesion worrisome for metastatic disease, biopsy was negative, PET scan was negative, and he continues to follow-up for routine imaging with oncology.  Very unlikely to be related to his history of prostate cancer with undetectable PSA or stable 1 cm right renal mass.  I personally viewed and interpreted the most recent MRI from December 2025 showing stable 1 cm right lower pole lesion, new adjacent 8 mm lesion and will repeat MRI in 6 months with oncology.  He has a history of intermittent microscopic hematuria, has deferred cystoscopy with his history of radical prostatectomy and risk of incontinence with cystoscopy.  Denies any gross hematuria.  CKD with recent creatinine 2.1, eGFR 32.  -Continue active surveillance for small right renal mass stable since 2018, new small lesion and will reevaluate on MRI in 6 months -Can continue yearly PSA monitoring, very low risk of recurrence now that he is almost 20 years out from definitive treatment and PSA remains undetectable -Consider cystoscopy if develops gross hematuria -RTC 1 year, will review 28-month MRI as well  [name], MD  [facility] [address], [address] [place], [place] [address] [phone]   "

## 2025-03-14 ENCOUNTER — Inpatient Hospital Stay

## 2025-03-17 ENCOUNTER — Other Ambulatory Visit

## 2025-03-21 ENCOUNTER — Inpatient Hospital Stay: Admitting: Oncology

## 2025-11-02 ENCOUNTER — Ambulatory Visit: Admitting: Urology
